# Patient Record
Sex: Male | Born: 1975 | Race: White | Hispanic: No | Marital: Married | Smoking: Former smoker
Health system: Southern US, Community
[De-identification: ages and names within clinical notes are randomized; demographics above are authoritative.]

## PROBLEM LIST (undated history)

## (undated) DIAGNOSIS — T7840XA Allergy, unspecified, initial encounter: Secondary | ICD-10-CM

## (undated) DIAGNOSIS — G249 Dystonia, unspecified: Secondary | ICD-10-CM

## (undated) HISTORY — PX: TESTICLE REMOVAL: SHX68

## (undated) HISTORY — DX: Dystonia, unspecified: G24.9

## (undated) HISTORY — DX: Allergy, unspecified, initial encounter: T78.40XA

---

## 2011-09-30 ENCOUNTER — Ambulatory Visit (INDEPENDENT_AMBULATORY_CARE_PROVIDER_SITE_OTHER): Payer: BC Managed Care – PPO | Admitting: Internal Medicine

## 2011-09-30 VITALS — BP 118/82 | HR 72 | Temp 97.9°F | Resp 16 | Ht 72.0 in | Wt 201.0 lb

## 2011-09-30 DIAGNOSIS — R05 Cough: Secondary | ICD-10-CM

## 2011-09-30 MED ORDER — ALBUTEROL SULFATE (2.5 MG/3ML) 0.083% IN NEBU: 2.5000 mg | INHALATION_SOLUTION | Freq: Once | RESPIRATORY_TRACT | Status: DC

## 2011-09-30 MED ORDER — ALBUTEROL SULFATE HFA 108 (90 BASE) MCG/ACT IN AERS: 2.0000 | INHALATION_SPRAY | Freq: Four times a day (QID) | RESPIRATORY_TRACT | Status: DC | PRN

## 2011-09-30 MED ORDER — AZITHROMYCIN 250 MG PO TABS: ORAL_TABLET | ORAL | Status: AC

## 2011-09-30 MED ORDER — IPRATROPIUM BROMIDE 0.06 % NA SOLN: 2.0000 | Freq: Three times a day (TID) | NASAL | Status: DC

## 2011-09-30 MED ORDER — ALBUTEROL SULFATE (2.5 MG/3ML) 0.083% IN NEBU
2.5000 mg | INHALATION_SOLUTION | Freq: Once | RESPIRATORY_TRACT | Status: AC
  Administered 2011-09-30: 2.5 mg via RESPIRATORY_TRACT

## 2011-09-30 NOTE — Progress Notes (Signed)
  Subjective:    Patient ID: David Silva, male    DOB: 1976-01-28, 36 y.o.   MRN: [id number]  HPI David Silva is here with a 3 day history of cough, rhinitis, without fever or chills.  He works in the Paediatric nurse (positive pressure) at [employer] and smokes electronic cigarettes.  He denies any history of pneumonia, asthma, or allergies.  He is generally healthy.  He has had exposure to one person at work who was ill.  He is coughing hard, but it is nonproductive and it is causing his throat to hurt.    Review of Systems  All other systems reviewed and are negative.  Negative except as noted in HPI     Objective:   Physical Exam  Vitals reviewed. Constitutional: He is oriented to person, place, and time. He appears well-developed and well-nourished.  HENT:  Head: Normocephalic.  Right Ear: External ear normal.  Left Ear: External ear normal.  Mouth/Throat: Oropharynx is clear and moist. No oropharyngeal exudate.  Eyes: Conjunctivae are normal.  Neck: Neck supple.  Cardiovascular: Normal rate, regular rhythm and normal heart sounds.   Pulmonary/Chest: Effort normal and breath sounds normal. No respiratory distress. He has no wheezes. He has no rales. He exhibits no tenderness.  Abdominal: Soft.  Lymphadenopathy:    He has no cervical adenopathy.  Neurological: He is alert and oriented to person, place, and time.  Skin: Skin is warm and dry.  Psychiatric: He has a normal mood and affect. His behavior is normal.          Assessment & Plan:  Cough with rhinitis, significantly improved after his albuterol nebulizer treatment.  Zpack, Atrovent nasal spray, and albuterol inhaler given to pt.  AVS printed and given pt.  RTC if not improved in 2-3 days.

## 2011-09-30 NOTE — Patient Instructions (Signed)
Take your azithromycin and inhaler and nasal spray as needed for your symptoms.  If you experience a worsening of your symptoms return to our clinic for evaluation. Bronchitis Bronchitis is the body's way of reacting to injury and/or infection (inflammation) of the bronchi. Bronchi are the air tubes that extend from the windpipe into the lungs. If the inflammation becomes severe, it may cause shortness of breath. CAUSES  Inflammation may be caused by:  A virus.   Germs (bacteria).   Dust.   Allergens.   Pollutants and many other irritants.  The cells lining the bronchial tree are covered with tiny hairs (cilia). These constantly beat upward, away from the lungs, toward the mouth. This keeps the lungs free of pollutants. When these cells become too irritated and are unable to do their job, mucus begins to develop. This causes the characteristic cough of bronchitis. The cough clears the lungs when the cilia are unable to do their job. Without either of these protective mechanisms, the mucus would settle in the lungs. Then you would develop pneumonia. Smoking is a common cause of bronchitis and can contribute to pneumonia. Stopping this habit is the single most important thing you can do to help yourself. TREATMENT   Your caregiver may prescribe an antibiotic if the cough is caused by bacteria. Also, medicines that open up your airways make it easier to breathe. Your caregiver may also recommend or prescribe an expectorant. It will loosen the mucus to be coughed up. Only take over-the-counter or prescription medicines for pain, discomfort, or fever as directed by your caregiver.   Removing whatever causes the problem (smoking, for example) is critical to preventing the problem from getting worse.   Cough suppressants may be prescribed for relief of cough symptoms.   Inhaled medicines may be prescribed to help with symptoms now and to help prevent problems from returning.   For those with  recurrent (chronic) bronchitis, there may be a need for steroid medicines.  SEEK IMMEDIATE MEDICAL CARE IF:   During treatment, you develop more pus-like mucus (purulent sputum).   You have a fever.   Your baby is older than 3 months with a rectal temperature of 102 F (38.9 C) or higher.   Your baby is 35 months old or younger with a rectal temperature of 100.4 F (38 C) or higher.   You become progressively more ill.   You have increased difficulty breathing, wheezing, or shortness of breath.  It is necessary to seek immediate medical care if you are elderly or sick from any other disease. MAKE SURE YOU:   Understand these instructions.   Will watch your condition.   Will get help right away if you are not doing well or get worse.  Document Released: 05/18/2005 Document Revised: 05/07/2011 Document Reviewed: 03/27/2008 [facility] Patient Information 2012 [place], [place].

## 2011-10-02 ENCOUNTER — Telehealth: Payer: Self-pay

## 2011-10-02 NOTE — Telephone Encounter (Signed)
.  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  Please call patient to advise at [phone].

## 2011-10-03 ENCOUNTER — Telehealth: Payer: Self-pay | Admitting: *Deleted

## 2011-10-03 NOTE — Telephone Encounter (Signed)
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 Glean Salvo. took phone call, placed PT on hold, and asked my advice. I advised Becky to tell the PT as long as he is on the up swing of his illness, taking his ABX, not running a fever, and feels good he is able to work. 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 Eileen Stanford

## 2011-10-04 NOTE — Telephone Encounter (Signed)
LMOM WITH NOTES AND TO CALL BACK IF NEEDED

## 2011-10-04 NOTE — Telephone Encounter (Signed)
Since he has been on the antibiotic now for several days, he should no longer be infectious.  If he is still having fever, he needs re-evaluation and shouldn't be at work.

## 2011-10-05 ENCOUNTER — Ambulatory Visit (INDEPENDENT_AMBULATORY_CARE_PROVIDER_SITE_OTHER): Payer: BC Managed Care – PPO | Admitting: Family Medicine

## 2011-10-05 ENCOUNTER — Encounter: Payer: Self-pay | Admitting: Family Medicine

## 2011-10-05 VITALS — BP 100/69 | HR 76 | Temp 97.8°F | Resp 18 | Ht 72.0 in | Wt 200.0 lb

## 2011-10-05 DIAGNOSIS — R059 Cough, unspecified: Secondary | ICD-10-CM

## 2011-10-05 DIAGNOSIS — J209 Acute bronchitis, unspecified: Secondary | ICD-10-CM

## 2011-10-05 DIAGNOSIS — R05 Cough: Secondary | ICD-10-CM

## 2011-10-05 MED ORDER — PREDNISONE 20 MG PO TABS: ORAL_TABLET | ORAL | Status: DC

## 2011-10-05 MED ORDER — HYDROCOD POLST-CHLORPHEN POLST 10-8 MG/5ML PO LQCR: 5.0000 mL | Freq: Two times a day (BID) | ORAL | Status: DC | PRN

## 2011-10-05 NOTE — Progress Notes (Signed)
Is a 36 year old gentleman comes in with persistent cough. He's been taking his inhalers which originally helped but are no longer effective. His cough is violent to the point of retching. He's able to sleep at night. He's having some chest tightness along with a cough.  No fever, no productive cough, no other upper respiratory symptoms.  Objective: Patient is coughing violently every 30 seconds or so. He's in no acute distress however.  HEENT: Unremarkable  Chest: Mild expiratory wheezes bilaterally  Heart: Regular no murmur  Skin: No significant eczema or rashes  Assessment: Reactive airways secondary to recent upper respiratory infection  Plan: Prednisone taper 20 mg 3-2-2-1-1-1 and Tussionex

## 2011-10-05 NOTE — Patient Instructions (Signed)
Cough, Adult  A cough is a reflex that helps clear your throat and airways. It can help heal the body or may be a reaction to an irritated airway. A cough may only last 2 or 3 weeks (acute) or may last more than 8 weeks (chronic).  CAUSES Acute cough:  Viral or bacterial infections.  Chronic cough:  Infections.   Allergies.   Asthma.   Post-nasal drip.   Smoking.   Heartburn or acid reflux.   Some medicines.   Chronic lung problems (COPD).   Cancer.  SYMPTOMS   Cough.   Fever.   Chest pain.   Increased breathing rate.   High-pitched whistling sound when breathing (wheezing).   Colored mucus that you cough up (sputum).  TREATMENT   A bacterial cough may be treated with antibiotic medicine.   A viral cough must run its course and will not respond to antibiotics.   Your caregiver may recommend other treatments if you have a chronic cough.  HOME CARE INSTRUCTIONS   Only take over-the-counter or prescription medicines for pain, discomfort, or fever as directed by your caregiver. Use cough suppressants only as directed by your caregiver.   Use a cold steam vaporizer or humidifier in your bedroom or home to help loosen secretions.   Sleep in a semi-upright position if your cough is worse at night.   Rest as needed.   Stop smoking if you smoke.  SEEK IMMEDIATE MEDICAL CARE IF:   You have pus in your sputum.   Your cough starts to worsen.   You cannot control your cough with suppressants and are losing sleep.   You begin coughing up blood.   You have difficulty breathing.   You develop pain which is getting worse or is uncontrolled with medicine.   You have a fever.  MAKE SURE YOU:   Understand these instructions.   Will watch your condition.   Will get help right away if you are not doing well or get worse.  Document Released: 11/14/2010 Document Revised: 05/07/2011 Document Reviewed: 11/14/2010 [facility] Patient Information 2012 [place],  [place].Asthma, Acute Bronchospasm Your exam shows you have asthma, or acute bronchospasm that acts like asthma. Bronchospasm means your air passages become narrowed. These conditions are due to inflammation and airway spasm that cause narrowing of the bronchial tubes in the lungs. This causes you to have wheezing and shortness of breath. CAUSES  Respiratory infections and allergies most often bring on these attacks. Smoking, air pollution, cold air, emotional upsets, and vigorous exercise can also bring them on.  TREATMENT   Treatment is aimed at making the narrowed airways larger. Mild asthma/bronchospasm is usually controlled with inhaled medicines. Albuterol is a common medicine that you breathe in to open spastic or narrowed airways. Some trade names for albuterol are Ventolin or Proventil. Steroid medicine is also used to reduce the inflammation when an attack is moderate or severe. Antibiotics (medications used to kill germs) are only used if a bacterial infection is present.   If you are pregnant and need to use Albuterol (Ventolin or Proventil), you can expect the baby to move more than usual shortly after the medicine is used.  HOME CARE INSTRUCTIONS   Rest.   Drink plenty of liquids. This helps the mucus to remain thin and easily coughed up. Do not use caffeine or alcohol.   Do not smoke. Avoid being exposed to second-hand smoke.   You play a critical role in keeping yourself in good health. Avoid exposure  to things that cause you to wheeze. Avoid exposure to things that cause you to have breathing problems. Keep your medications up-to-date and available. Carefully follow your doctor's treatment plan.   When pollen or pollution is bad, keep windows closed and use an air conditioner go to places with air conditioning. If you are allergic to furry pets or birds, find new homes for them or keep them outside.   Take your medicine exactly as prescribed.   Asthma requires careful medical  attention. See your caregiver for follow-up as advised. If you are more than [redacted] weeks pregnant and you were prescribed any new medications, let your Obstetrician know about the visit and how you are doing. Arrange a recheck.  SEEK IMMEDIATE MEDICAL CARE IF:   You are getting worse.   You have trouble breathing. If severe, call 911.   You develop chest pain or discomfort.   You are throwing up or not drinking fluids.   You are not getting better within 24 hours.   You are coughing up yellow, green, brown, or bloody sputum.   You develop a fever over 102 F (38.9 C).   You have trouble swallowing.  MAKE SURE YOU:   Understand these instructions.   Will watch your condition.   Will get help right away if you are not doing well or get worse.  Document Released: 09/02/2006 Document Revised: 05/07/2011 Document Reviewed: 05/02/2007 [facility] Patient Information 2012 [place], [place].

## 2011-10-12 ENCOUNTER — Telehealth: Payer: Self-pay

## 2011-10-12 NOTE — Telephone Encounter (Signed)
Patient was off work 8 days. He wants to file short term disability for 3 of those days. (work suggested that her do this). They need a letter stating he was out of work due to illness. He stated there was no form to be filled out. Please fax it to [phone], attn: David Stall

## 2011-10-12 NOTE — Telephone Encounter (Signed)
Dr. Milus Glazier,  Did you intend for this patient to be out of work?  Please reply in DOCUMENTATION and route to the clinical message pool.  Thanks!

## 2011-10-12 NOTE — Telephone Encounter (Signed)
PT WOULD LIKE TO SPEAK WITH DR KURT REGARDING HIS VISIT. STATES IT WASN'T ABOUT ANY MEDICINE PLEASE CALL [phone]

## 2011-10-12 NOTE — Telephone Encounter (Signed)
Okay to be out 3 days.

## 2011-10-13 NOTE — Telephone Encounter (Signed)
LMOM TO CB WITH DATES

## 2011-10-13 NOTE — Telephone Encounter (Signed)
Pt notified and note faxed.

## 2012-02-15 ENCOUNTER — Ambulatory Visit (INDEPENDENT_AMBULATORY_CARE_PROVIDER_SITE_OTHER): Payer: BC Managed Care – PPO | Admitting: Family Medicine

## 2012-02-15 VITALS — BP 128/74 | HR 61 | Temp 97.9°F | Resp 18 | Ht 73.0 in | Wt 215.0 lb

## 2012-02-15 DIAGNOSIS — M79604 Pain in right leg: Secondary | ICD-10-CM

## 2012-02-15 DIAGNOSIS — M79609 Pain in unspecified limb: Secondary | ICD-10-CM

## 2012-02-15 DIAGNOSIS — M62830 Muscle spasm of back: Secondary | ICD-10-CM

## 2012-02-15 DIAGNOSIS — M6740 Ganglion, unspecified site: Secondary | ICD-10-CM

## 2012-02-15 DIAGNOSIS — M5380 Other specified dorsopathies, site unspecified: Secondary | ICD-10-CM

## 2012-02-15 MED ORDER — NABUMETONE 750 MG PO TABS: 750.0000 mg | ORAL_TABLET | Freq: Two times a day (BID) | ORAL | Status: DC

## 2012-02-15 MED ORDER — METAXALONE 800 MG PO TABS: 800.0000 mg | ORAL_TABLET | Freq: Three times a day (TID) | ORAL | Status: DC

## 2012-02-15 NOTE — Patient Instructions (Signed)
Try to walk around some to stretch out legs, but minimize prolonged standing as possible.  Heat or ice/heat to back  If cyst is not going down over the next week we will aspirate and inject it.    If spasms continue many need physical therapy

## 2012-02-15 NOTE — Progress Notes (Signed)
Subjective: Patient has several problems. He has a painful tendon area on the lateral right foot that has been bothering him for couple weeks. He feels like that discomfort is caused him to walk funny, which has flared a cold back spasm problem that he's had for maybe 5 years. He's had severe spasm in his back, and now has pain in both legs. He feels like he walking funny from his back has pulled his legs also. He stands a lot at work. A specific injury. He does not play sports.  Objective: 36 year old male in a moderate amount of discomfort. Whenever he moves around it hurts. He has back appears mildly asymmetrical, with some prominence of the musculature in the right back medial and inferior to the scapula. That is where he sits hurts most also. He has fair flexion and extension and side to side tilt, though tilted to the left hurts him and advance flexing anteriorly past about 30 or 40 was hurting him. Straight leg raising test is negative on the left, but on the right he has tightness and pain, at about 60 in this region after.  Dorsiflexion and plantar flexion of his feet making her up in his legs. He has a nodular area on the right foot distal to the lateral malleolus which is more prominent when he dorsiflexes the foot. It is fluctuant feeling.  Assessment: Ganglion cyst right foot Back spasms Leg pains  Plan: Anti-inflammatory medications and muscle relaxants.

## 2012-11-25 ENCOUNTER — Ambulatory Visit (INDEPENDENT_AMBULATORY_CARE_PROVIDER_SITE_OTHER): Payer: BC Managed Care – PPO | Admitting: Family Medicine

## 2012-11-25 ENCOUNTER — Ambulatory Visit: Payer: BC Managed Care – PPO

## 2012-11-25 VITALS — BP 103/71 | HR 63 | Temp 98.1°F | Resp 16 | Ht 73.5 in | Wt 221.0 lb

## 2012-11-25 DIAGNOSIS — M79671 Pain in right foot: Secondary | ICD-10-CM

## 2012-11-25 DIAGNOSIS — M25473 Effusion, unspecified ankle: Secondary | ICD-10-CM

## 2012-11-25 DIAGNOSIS — M25474 Effusion, right foot: Secondary | ICD-10-CM

## 2012-11-25 DIAGNOSIS — M79609 Pain in unspecified limb: Secondary | ICD-10-CM

## 2012-11-25 MED ORDER — MELOXICAM 7.5 MG PO TABS: 7.5000 mg | ORAL_TABLET | Freq: Every day | ORAL | Status: DC

## 2012-11-25 NOTE — Patient Instructions (Addendum)
We will refer you to podiatry.  Try over the counter arch support insert.  mobic - 1-2 per day as needed - do not take other NSAIDS while taking this. Return to the clinic or go to the nearest emergency room if any of your symptoms worsen or new symptoms occur.

## 2012-11-25 NOTE — Progress Notes (Signed)
  Subjective:    Patient ID: David Silva, male    DOB: Aug 17, 1975, 37 y.o.   MRN: [id number]  HPI David Silva is a 37 y.o. male Here for R foot cyst. Initially noticed about a year ago, NKI. Out of the blue. Goes down then comes back. Initially for 3 weeks - treated with antiinflammatory.  Improved but not sure if it went away completely.  Current episode of swelling past week - more sore to stand, but walking ok.  Sore to move ankle certain ways.   Sore into legs at times - may be walking differently.  Back spasms at times.   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    Tx:otc Advil 400mg  tid at times.   Review of Systems  Constitutional: Negative for fever and chills.  Musculoskeletal: Positive for joint swelling (top of r foot. ) and arthralgias.  Skin: Positive for color change. Negative for rash and wound.       Objective:   Physical Exam  Vitals reviewed. Constitutional: He is oriented to person, place, and time. He appears well-developed and well-nourished. No distress.  Pulmonary/Chest: Effort normal.  Musculoskeletal:       Right lower leg: He exhibits no tenderness, no swelling and no edema.       Left lower leg: He exhibits no tenderness, no swelling and no edema.       Right foot: He exhibits swelling. He exhibits normal range of motion (from and resisted strength. ), no bony tenderness and normal capillary refill.       Feet:  Neurological: He is alert and oriented to person, place, and time.  nvi distally.   Skin: Skin is warm and dry. No rash noted.  Psychiatric: He has a normal mood and affect. His behavior is normal.      [facility] reading (PRIMARY) by  Dr. Neva Seat: R foot - NAD.Marland Kitchen      Assessment & Plan:  David Silva is a 37 y.o. male Right foot pain - Plan: DG Foot Complete Right  Swelling of foot joint, right - Plan: DG Foot Complete Right  Possible ganglion cyst of R foot - recurrent. Trial of mobic QD  prn, refer to podiatry for eval for aspiration. otc arch support trial prior to podiatry eval. rtc precautions.   Meds ordered this encounter  Medications  . meloxicam (MOBIC) 7.5 MG tablet    Sig: Take 1 tablet (7.5 mg total) by mouth daily.    Dispense:  30 tablet    Refill:  0

## 2013-07-03 ENCOUNTER — Ambulatory Visit (INDEPENDENT_AMBULATORY_CARE_PROVIDER_SITE_OTHER): Payer: BC Managed Care – PPO | Admitting: Family Medicine

## 2013-07-03 ENCOUNTER — Ambulatory Visit: Payer: BC Managed Care – PPO

## 2013-07-03 VITALS — BP 120/80 | HR 70 | Temp 97.1°F | Resp 16 | Ht 71.5 in | Wt 219.0 lb

## 2013-07-03 DIAGNOSIS — M5380 Other specified dorsopathies, site unspecified: Secondary | ICD-10-CM

## 2013-07-03 DIAGNOSIS — M549 Dorsalgia, unspecified: Secondary | ICD-10-CM

## 2013-07-03 DIAGNOSIS — T148XXA Other injury of unspecified body region, initial encounter: Secondary | ICD-10-CM

## 2013-07-03 DIAGNOSIS — M79609 Pain in unspecified limb: Secondary | ICD-10-CM

## 2013-07-03 DIAGNOSIS — M62830 Muscle spasm of back: Secondary | ICD-10-CM

## 2013-07-03 MED ORDER — METAXALONE 800 MG PO TABS: 800.0000 mg | ORAL_TABLET | Freq: Three times a day (TID) | ORAL | Status: DC

## 2013-07-03 MED ORDER — NABUMETONE 750 MG PO TABS: 750.0000 mg | ORAL_TABLET | Freq: Two times a day (BID) | ORAL | Status: DC
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
## 2013-07-03 NOTE — Patient Instructions (Signed)
Back Pain, Adult Low back pain is very common. About 1 in 5 people have back pain.The cause of low back pain is rarely dangerous. The pain often gets better over time.About half of people with a sudden onset of back pain feel better in just 2 weeks. About 8 in 10 people feel better by 6 weeks.  CAUSES Some common causes of back pain include:  Strain of the muscles or ligaments supporting the spine.  Wear and tear (degeneration) of the spinal discs.  Arthritis.  Direct injury to the back. DIAGNOSIS Most of the time, the direct cause of low back pain is not known.However, back pain can be treated effectively even when the exact cause of the pain is unknown.Answering your caregiver's questions about your overall health and symptoms is one of the most accurate ways to make sure the cause of your pain is not dangerous. If your caregiver needs more information, he or she may order lab work or imaging tests (X-rays or MRIs).However, even if imaging tests show changes in your back, this usually does not require surgery. HOME CARE INSTRUCTIONS For many people, back pain returns.Since low back pain is rarely dangerous, it is often a condition that people can learn to manageon their own.   Remain active. It is stressful on the back to sit or stand in one place. Do not sit, drive, or stand in one place for more than 30 minutes at a time. Take short walks on level surfaces as soon as pain allows.Try to increase the length of time you walk each day.  Do not stay in bed.Resting more than 1 or 2 days can delay your recovery.  Do not avoid exercise or work.Your body is made to move.It is not dangerous to be active, even though your back may hurt.Your back will likely heal faster if you return to being active before your pain is gone.  Pay attention to your body when you bend and lift. Many people have less discomfortwhen lifting if they bend their knees, keep the load close to their bodies,and  avoid twisting. Often, the most comfortable positions are those that put less stress on your recovering back.  Find a comfortable position to sleep. Use a firm mattress and lie on your side with your knees slightly bent. If you lie on your back, put a pillow under your knees.  Only take over-the-counter or prescription medicines as directed by your caregiver. Over-the-counter medicines to reduce pain and inflammation are often the most helpful.Your caregiver may prescribe muscle relaxant drugs.These medicines help dull your pain so you can more quickly return to your normal activities and healthy exercise.  Put ice on the injured area.  Put ice in a plastic bag.  Place a towel between your skin and the bag.  Leave the ice on for 15-20 minutes, 03-04 times a day for the first 2 to 3 days. After that, ice and heat may be alternated to reduce pain and spasms.  Ask your caregiver about trying back exercises and gentle massage. This may be of some benefit.  Avoid feeling anxious or stressed.Stress increases muscle tension and can worsen back pain.It is important to recognize when you are anxious or stressed and learn ways to manage it.Exercise is a great option. SEEK MEDICAL CARE IF:  You have pain that is not relieved with rest or medicine.  You have pain that does not improve in 1 week.  You have new symptoms.  You are generally not feeling well. SEEK   IMMEDIATE MEDICAL CARE IF:   You have pain that radiates from your back into your legs.  You develop new bowel or bladder control problems.  You have unusual weakness or numbness in your arms or legs.  You develop nausea or vomiting.  You develop abdominal pain.  You feel faint. Document Released: 05/18/2005 Document Revised: 11/17/2011 Document Reviewed: 10/06/2010 Exit[employer] Patient Information 2014 [employer].  

## 2013-12-11 ENCOUNTER — Ambulatory Visit (INDEPENDENT_AMBULATORY_CARE_PROVIDER_SITE_OTHER): Payer: BC Managed Care – PPO | Admitting: Physician Assistant

## 2013-12-11 VITALS — BP 122/88 | HR 72 | Temp 97.4°F | Resp 18 | Ht 72.25 in | Wt 218.4 lb

## 2013-12-11 DIAGNOSIS — M5380 Other specified dorsopathies, site unspecified: Secondary | ICD-10-CM

## 2013-12-11 DIAGNOSIS — M62838 Other muscle spasm: Secondary | ICD-10-CM

## 2013-12-11 DIAGNOSIS — R635 Abnormal weight gain: Secondary | ICD-10-CM

## 2013-12-11 DIAGNOSIS — R209 Unspecified disturbances of skin sensation: Secondary | ICD-10-CM

## 2013-12-11 DIAGNOSIS — M62830 Muscle spasm of back: Secondary | ICD-10-CM

## 2013-12-11 LAB — COMPREHENSIVE METABOLIC PANEL
ALT: 22 U/L (ref 0–53)
AST: 18 U/L (ref 0–37)
Albumin: 4.6 g/dL (ref 3.5–5.2)
Alkaline Phosphatase: 65 U/L (ref 39–117)
BUN: 12 mg/dL (ref 6–23)
CO2: 27 mEq/L (ref 19–32)
Calcium: 9.6 mg/dL (ref 8.4–10.5)
Chloride: 106 mEq/L (ref 96–112)
Creat: 0.91 mg/dL (ref 0.50–1.35)
Glucose, Bld: 78 mg/dL (ref 70–99)
Potassium: 4.2 mEq/L (ref 3.5–5.3)
Sodium: 141 mEq/L (ref 135–145)
Total Bilirubin: 0.4 mg/dL (ref 0.2–1.2)
Total Protein: 6.9 g/dL (ref 6.0–8.3)

## 2013-12-11 LAB — TSH: TSH: 1.955 u[IU]/mL (ref 0.350–4.500)

## 2013-12-11 MED ORDER — NABUMETONE 750 MG PO TABS: 750.0000 mg | ORAL_TABLET | Freq: Two times a day (BID) | ORAL | Status: DC

## 2013-12-11 MED ORDER — METAXALONE 800 MG PO TABS: 800.0000 mg | ORAL_TABLET | Freq: Three times a day (TID) | ORAL | Status: DC

## 2013-12-11 NOTE — Progress Notes (Signed)
   Subjective:    Patient ID: David Silva, male    DOB: 01-02-1976, 38 y.o.   MRN: [id number]  HPI 38 year old male presents for evaluation of muscle spasms. States this is a chronic problem that has been ongoing for over 15 years. He has intermittent flare ups of severe muscle spasms. Admits they usually respond well to anti-inflammatory medications and muscle relaxers. Was last seen in 07/2013 - had negative x-rays at the time and was placed on relafen and skelaxin which he reports worked quite well. Usually has relief in 3-4 days after started medications. Does get some relief from minor pain/spasm with OTC ibuprofen.  Cramping/spasm involves his mid-lower back but also has intermittent leg pains and cramping. Admits to a 20 pound weight gain in the past 2 years. He is working on [employer] and is counting calories. Is frustrated because he cannot exercise as much as he used to due to the muscle aches and pains. Is interested in an orthopedic evaluation at this time.  No bowel/bladder incontinence, saddle anesthesias, or weakness   Review of Systems  Constitutional: Positive for activity change (decreased exercise). Negative for fever and chills.  Gastrointestinal: Negative for abdominal pain.  Musculoskeletal: Positive for back pain and myalgias. Negative for joint swelling.  Neurological: Negative for weakness and numbness.       Objective:   Physical Exam  Constitutional: He is oriented to person, place, and time. He appears well-developed and well-nourished.  HENT:  Head: Normocephalic and atraumatic.  Right Ear: Hearing and external ear normal.  Left Ear: Hearing and external ear normal.  Eyes: Conjunctivae are normal.  Neck: Normal range of motion.  Cardiovascular: Normal rate.   Pulmonary/Chest: Effort normal.  Musculoskeletal:       Lumbar back: He exhibits decreased range of motion (secondary to spasm and pain), tenderness (right paraspinal) and spasm. He exhibits no bony  tenderness and no deformity.  5/5 strength  Neurological: He is alert and oriented to person, place, and time. He has normal strength.  Reflex Scores:      Patellar reflexes are 2+ on the right side and 2+ on the left side. SLR negative  Psychiatric: He has a normal mood and affect. His behavior is normal. Judgment and thought content normal.          Assessment & Plan:  Back spasm - Plan: metaxalone (SKELAXIN) 800 MG tablet, nabumetone (RELAFEN) 750 MG tablet, Ambulatory referral to Orthopedic Surgery  Spasm of muscle - Plan: Ambulatory referral to Orthopedic Surgery, Comprehensive metabolic panel  Disturbance of skin sensation - Plan: Comprehensive metabolic panel  Weight gain - Plan: TSH  Will treat with Relafen 750 mg bid and Skelaxin qhs prn spasm Referral placed to orthopedics for further w/u and management TSH, CMET pending RTC precautions discussed. F/u if symptoms worsening or fail to improve.

## 2013-12-15 ENCOUNTER — Telehealth: Payer: Self-pay | Admitting: Physician Assistant

## 2013-12-15 NOTE — Telephone Encounter (Signed)
Patient dropped off FMLA paperwork on 12/14/2013 and they were placed in [facility] box the following day on 12/15/2013. Patient saw her on 12/11/2013 for back spasms (previously saw Dr. Marin Comment in February for back pain). Patient wants paperwork dated through his ortho visit on 12/15/2013 however this is up to the discretion of the provider if she chooses to include this date or not. Please return to FMLA/Disabilities tray at checkout upon completion.   Thanks, [employer]

## 2014-01-15 ENCOUNTER — Encounter: Payer: Self-pay | Admitting: Neurology

## 2014-01-15 ENCOUNTER — Ambulatory Visit (INDEPENDENT_AMBULATORY_CARE_PROVIDER_SITE_OTHER): Payer: BC Managed Care – PPO | Admitting: Neurology

## 2014-01-15 VITALS — BP 116/76 | HR 91 | Temp 98.3°F | Ht 73.0 in | Wt 222.0 lb

## 2014-01-15 DIAGNOSIS — R251 Tremor, unspecified: Secondary | ICD-10-CM

## 2014-01-15 DIAGNOSIS — M5380 Other specified dorsopathies, site unspecified: Secondary | ICD-10-CM

## 2014-01-15 DIAGNOSIS — R259 Unspecified abnormal involuntary movements: Secondary | ICD-10-CM

## 2014-01-15 DIAGNOSIS — M62830 Muscle spasm of back: Secondary | ICD-10-CM

## 2014-01-15 NOTE — Progress Notes (Signed)
Subjective:    Patient ID: David Silva is a 38 y.o. male.  HPI    Star Age, MD, PhD [facility] [address] [place], [place] [address]  Dear Dr. Berenice Primas,   I saw your patient, David Silva, upon your kind request in my neurologic clinic today for initial consultation of his tremors. The patient is unaccompanied today. As you know, Mr. [name] is a 38 year old right-handed gentleman with an underlying medical history of muscle spasms, who has had a tremor in his RUE for the past month. He has had lower back spasms for years and is currently on an antiinflammatory and muscle relaxors. He had an appointment with an orthopedist at [facility], but canceled it. He feels, his tremors are getting worse. He has shooting pains in his muscle. He quit smoking, but smokes e-cigarattes, he drinks alcohol rarely and drinks about 3 caffeinated drinks per day. He feels that the spasms in his tremor interferes with his work. 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 He is wondering whether this is all related. He does not have a family history of muscle diseases or nerve disease as her tremors. A maternal great uncle had MS he reports.  His Past Medical History Is Significant For: Past Medical History  Diagnosis Date  . Allergy     His Past Surgical History Is Significant For: No past surgical history on file.  His Family History Is Significant For: Family History  Problem Relation Age of Onset  . Diabetes Maternal Grandmother   . Heart disease Maternal Grandmother     His Social History Is Significant For: History   Social History  . Marital Status: Single    Spouse Name: N/A    Number of Children: 0  . Years of Education: college   Occupational History  .      [place] History Main Topics  . Smoking status: Former Smoker    Quit date: 06/01/2013  . Smokeless tobacco: Current User     Comment: electronic cigs.  daily  . Alcohol Use: Yes     Comment: 4 drinks per  yearly  . Drug Use: No  . Sexual Activity: None   Other Topics Concern  . None   Social History Narrative   Patient resides with a friend    His Allergies Are:  Allergies  Allergen Reactions  . Sulfate   :   His Current Medications Are:  Outpatient Encounter Prescriptions as of 01/15/2014  Medication Sig  . cyclobenzaprine (FLEXERIL) 10 MG tablet Take 10 mg by mouth daily.  . methocarbamol (ROBAXIN) 750 MG tablet Take 750 mg by mouth 2 (two) times daily.  . nabumetone (RELAFEN) 750 MG tablet Take 1 tablet (750 mg total) by mouth 2 (two) times daily.  . [DISCONTINUED] albuterol (PROVENTIL HFA;VENTOLIN HFA) 108 (90 BASE) MCG/ACT inhaler Inhale 2 puffs into the lungs every 6 (six) hours as needed for wheezing.  . [DISCONTINUED] ibuprofen (ADVIL,MOTRIN) 400 MG tablet Take 400 mg by mouth every 6 (six) hours as needed.  . [DISCONTINUED] ipratropium (ATROVENT) 0.06 % nasal spray Place 2 sprays into the nose 3 (three) times daily.  . [DISCONTINUED] meloxicam (MOBIC) 7.5 MG tablet Take 1 tablet (7.5 mg total) by mouth daily.  . [DISCONTINUED] metaxalone (SKELAXIN) 800 MG tablet Take 1 tablet (800 mg total) by mouth 3 (three) times daily.  :   Review of Systems:  Out of a complete 14  point review of systems, all are reviewed and negative with the exception of these symptoms as listed below:   Review of Systems  Musculoskeletal: Positive for joint swelling.       Cramps, aching muscles  Neurological: Positive for tremors, weakness, numbness and headaches.    Objective:  Neurologic Exam  Physical Exam Physical Examination:   Filed Vitals:   01/15/14 0930  BP: 116/76  Pulse: 91  Temp: 98.3 F (36.8 C)    General Examination: The patient is a very pleasant 38 y.o. male in no acute distress. He appears well-developed and well-nourished and adequately groomed. He is quite nervous and anxious appearing. He is pacing from time  to time stating that it helps his back spasms. He is sweating quite profusely at times.  HEENT: Normocephalic, atraumatic, pupils are equal, round and reactive to light and accommodation. Funduscopic exam is normal with sharp disc margins noted. Extraocular tracking is good without limitation to gaze excursion or nystagmus noted. Normal smooth pursuit is noted. Hearing is grossly intact. Tympanic membranes are clear bilaterally. Face is symmetric with normal facial animation and normal facial sensation. Speech is clear with no dysarthria noted. There is no hypophonia. There is no lip, neck/head, jaw or voice tremor. Neck is supple with full range of passive and active motion. There are no carotid bruits on auscultation. Oropharynx exam reveals: mild mouth dryness, adequate dental hygiene and mild airway crowding.   Chest: Clear to auscultation without wheezing, rhonchi or crackles noted.  Heart: S1+S2+0, regular and normal without murmurs, rubs or gallops noted.   Abdomen: Soft, non-tender and non-distended with normal bowel sounds appreciated on auscultation.  Extremities: There is no pitting edema in the distal lower extremities bilaterally. Pedal pulses are intact.  Skin: Warm and dry without trophic changes noted. There are no varicose veins.  Musculoskeletal: exam reveals no obvious joint deformities, tenderness or joint swelling or erythema. He reports back spasms in the entire back.   Neurologically:  Mental status: The patient is awake, alert and oriented in all 4 spheres. His immediate and remote memory, attention, language skills and fund of knowledge are appropriate. There is no evidence of aphasia, agnosia, apraxia or anomia. Speech is clear with normal prosody and enunciation. Thought process is linear. Mood is constricted and affect is blunted.  Cranial nerves II - XII are as described above under HEENT exam. In addition: shoulder shrug is normal with equal shoulder height  noted. Motor exam: Normal bulk, strength and tone is noted. I do not detect any myoclonus or athetoid movements. He has no choreiform movements. There is no drift, or rebound. There is no resting tremor. There is a bilateral upper extremity postural and action tremor, which is minimal in degree, R more than L. There tremor frequency is fairly fast and the amplitude is small. On Archimedes spiral drawing there is mild tremulousness noted. Handwriting is not tremulous and illegible. There is no evidence of micrographia.  Romberg is negative. Reflexes are 2+ throughout. Babinski: Toes are flexor bilaterally. He has no ankle clonus. Fine motor skills and coordination: intact with normal finger taps, normal hand movements, normal rapid alternating patting, normal foot taps and normal foot agility.  Cerebellar testing: No dysmetria or intention tremor on finger to nose testing. Heel to shin is unremarkable bilaterally. There is no truncal or gait ataxia.  Sensory exam: intact to light touch, pinprick, vibration, temperature sense in the upper and lower extremities.  Gait, station and balance: He stands easily. No  veering to one side is noted. No leaning to one side is noted. Posture is age-appropriate and stance is narrow based. Gait shows normal stride length and normal pace. No problems turning are noted. He turns en bloc. Tandem walk is unremarkable. Intact toe and heel stance is noted.               Assessment and Plan:    In summary, Simpson Paulos is a very pleasant 38 y.o.-year old male with an underlying medical history of muscle spasms of over 15 years duration who presents with a one-month history of right upper extremity tremor. On examination he has a minimal degree of upper extremity postural and action tremor. This could be an enhanced physiological tremor. He does appear to be quite nervous and anxious today. He has been treated symptomatically with anti-inflammatory and muscle relaxers for his  back spasms. I suggested that we proceed with further blood work and an EMG nerve conduction study. We will call him with his test results. I suggested no new medications for his tremors. I explained to him that his tremors appear to be rather benign. He is reassured in that regard. Overall, his neurological exam is non-focal with the exception that he continues to report back spasms, which he reports have actually been better in the last 6 weeks. I suggested a see him back when his tests are completed. We will also be keeping him updated as we get test results back. I also explained to him that there is usually no specific medication for tremors. He certainly does not have any parkinsonism or abnormal involuntary movements that I could see other than a mild degree of tremor, right more than left.   I answered all his questions today and the patient was in agreement with the above outlined plan.   Thank you very much for allowing me to participate in the care of this nice patient. If I can be of any further assistance to you please do not hesitate to call me at [phone].  Sincerely,   Star Age, MD, PhD

## 2014-01-15 NOTE — Patient Instructions (Addendum)
  Please remember, that any kind of tremor may be exacerbated by anxiety, anger, nervousness, excitement, dehydration, sleep deprivation, by caffeine, and low blood sugar values or blood sugar fluctuations. Some medications, especially some antidepressants and lithium can cause or exacerbate tremors. Tremors may temporarily calm down her subside with the use of a benzodiazepine such as Valium or related medications and with alcohol. Be aware however that drinking alcohol is not an approved treatment or appropriate treatment for tremor control and long-term use of benzodiazepines such as Valium, lorazepam, alprazolam, or clonazepam can cause habit formation, physical and psychological addiction.  We will do blood work and an EMG (muscle electrical testing) and nerve conduction testing. No new medications are recommended at this time.  Try to reduce your caffeine intake to see if the tremor improves.

## 2014-01-16 ENCOUNTER — Ambulatory Visit (INDEPENDENT_AMBULATORY_CARE_PROVIDER_SITE_OTHER): Payer: BC Managed Care – PPO | Admitting: Family Medicine

## 2014-01-16 VITALS — BP 126/88 | HR 72 | Temp 98.1°F | Resp 16 | Ht 71.75 in | Wt 217.8 lb

## 2014-01-16 DIAGNOSIS — E559 Vitamin D deficiency, unspecified: Secondary | ICD-10-CM

## 2014-01-16 DIAGNOSIS — M5380 Other specified dorsopathies, site unspecified: Secondary | ICD-10-CM

## 2014-01-16 DIAGNOSIS — F439 Reaction to severe stress, unspecified: Secondary | ICD-10-CM

## 2014-01-16 DIAGNOSIS — M546 Pain in thoracic spine: Secondary | ICD-10-CM

## 2014-01-16 DIAGNOSIS — M79605 Pain in left leg: Secondary | ICD-10-CM

## 2014-01-16 DIAGNOSIS — Z733 Stress, not elsewhere classified: Secondary | ICD-10-CM

## 2014-01-16 DIAGNOSIS — R259 Unspecified abnormal involuntary movements: Secondary | ICD-10-CM

## 2014-01-16 DIAGNOSIS — R251 Tremor, unspecified: Secondary | ICD-10-CM

## 2014-01-16 DIAGNOSIS — M62830 Muscle spasm of back: Secondary | ICD-10-CM

## 2014-01-16 DIAGNOSIS — M542 Cervicalgia: Secondary | ICD-10-CM

## 2014-01-16 DIAGNOSIS — M79609 Pain in unspecified limb: Secondary | ICD-10-CM

## 2014-01-16 MED ORDER — SERTRALINE HCL 50 MG PO TABS: 50.0000 mg | ORAL_TABLET | Freq: Every day | ORAL | Status: DC

## 2014-01-16 MED ORDER — VITAMIN D3 1.25 MG (50000 UT) PO CAPS: 50000.0000 [IU] | ORAL_CAPSULE | ORAL | Status: DC

## 2014-01-16 NOTE — Progress Notes (Signed)
Subjective: Patient has continued to have problems with muscle spasms over the past month since seeing the PA here. He has seen an orthopedist several times. He has had a CT scan of his neck. No disc pinching anything. He saw a neurologist yesterday and a lot of labs were ordered. She was primarily interested in the tremor of his hand which she did not seem to think was anything of great significance. He has a lot of pain in his neck, pain and spasming in his back, and chronic hurting in his legs. He was told he had a small amount of clonus. This concerns him also, but the neurologist didn't comment on it. He denies any major new stress, other than the stress of not being get back to work yet. He's been out of work since he saw the PA. He feels pretty miserable.  Objective: No major distress. Chest clear. Heart regular without murmurs. Neck was fairly good range of motion but hurts when moving around. Tenderness in the paraspinous muscles of the neck. Mild tenderness both sides of back, more on the left than the right it seems, percussion. Spine nontender. Straight leg raising reveals tight hamstrings, more on the right than the left. The legs are not atrophic. Some of the labs at the neurologist got yesterday back. The vitamin D level is very low.  Assessment: Cervical pain and back pain and spasms History of tremor right arm, not grossly apparent right now. Stress Vitamin D deficiency  Plan: Vitamin D 50,000 units weekly for 6 weeks. Then we will need to get him on a daily dose of [phone] units daily. He is to return in 6 weeks.  Decided to begin him on sertraline for his anxiety. It may or may not help the muscle pains, but I thought it was worth a try.  Referral is being made to another neurologist since he would like a different opinion, will try [facility].

## 2014-01-16 NOTE — Patient Instructions (Signed)
Take the sertraline one daily. It is my hope that after about 2 weeks she will start feeling better on it.  Take the vitamin D 50,000 units one weekly for 6 weeks.  Referral is being made to [facility] for neurovascular evaluation  Try to get some regular exercise

## 2014-01-18 LAB — CERULOPLASMIN: Ceruloplasmin: 21.8 mg/dL (ref 15.0–30.0)

## 2014-01-18 LAB — ANA W/REFLEX: Anti Nuclear Antibody(ANA): NEGATIVE

## 2014-01-18 LAB — COPPER, SERUM: COPPER: 94 ug/dL (ref 72–166)

## 2014-01-18 LAB — VITAMIN E: Vitamin E (Alpha Tocopherol): 12.7 mg/L (ref 4.6–17.8)

## 2014-01-18 LAB — VITAMIN D 25 HYDROXY (VIT D DEFICIENCY, FRACTURES): Vit D, 25-Hydroxy: 12.4 ng/mL — ABNORMAL LOW (ref 30.0–100.0)

## 2014-01-18 LAB — VITAMIN B6: Vitamin B6: 7.8 ug/L (ref 5.3–46.7)

## 2014-01-18 LAB — HGB A1C W/O EAG: HEMOGLOBIN A1C: 5.5 % (ref 4.8–5.6)

## 2014-01-18 LAB — C-REACTIVE PROTEIN: CRP: 0.8 mg/L (ref 0.0–4.9)

## 2014-01-18 LAB — CK: Total CK: 98 U/L (ref 24–204)

## 2014-01-18 NOTE — Progress Notes (Signed)
Quick Note:  Please call patient, all labs look okay including vitamin B6, vitamin D, inflammatory marker and muscle enzyme as well as diabetes marker. His vitamin D level was low and he can take an over-the-counter vitamin D supplement, one to 2000 units daily should be fine. Vitamin D level can be checked in the future by his primary care physician after he has started supplementation. Recheck a vitamin D level by his PCP in 3 months should be fine. Star Age, MD, PhD [facility]  ______

## 2014-01-19 ENCOUNTER — Telehealth: Payer: Self-pay | Admitting: *Deleted

## 2014-01-19 NOTE — Telephone Encounter (Signed)
Patient aware of lab results.

## 2014-01-19 NOTE — Telephone Encounter (Signed)
Left message for patient to call for results.  Please call patient, all labs look okay including vitamin B6, vitamin D, inflammatory marker and muscle enzyme as well as diabetes marker. His vitamin D level was low and he can take an over-the-counter vitamin D supplement, one to 2000 units daily should be fine. Vitamin D level can be checked in the future by his primary care physician after he has started supplementation. Recheck a vitamin D level by his PCP in 3 months should be fine.

## 2014-01-29 ENCOUNTER — Ambulatory Visit (INDEPENDENT_AMBULATORY_CARE_PROVIDER_SITE_OTHER): Payer: BC Managed Care – PPO | Admitting: Neurology

## 2014-01-29 ENCOUNTER — Encounter (INDEPENDENT_AMBULATORY_CARE_PROVIDER_SITE_OTHER): Payer: Self-pay | Admitting: Radiology

## 2014-01-29 DIAGNOSIS — Z0289 Encounter for other administrative examinations: Secondary | ICD-10-CM

## 2014-01-29 DIAGNOSIS — R259 Unspecified abnormal involuntary movements: Secondary | ICD-10-CM

## 2014-01-29 DIAGNOSIS — M5380 Other specified dorsopathies, site unspecified: Secondary | ICD-10-CM

## 2014-01-29 DIAGNOSIS — R209 Unspecified disturbances of skin sensation: Secondary | ICD-10-CM

## 2014-01-29 DIAGNOSIS — R251 Tremor, unspecified: Secondary | ICD-10-CM

## 2014-01-29 DIAGNOSIS — M62830 Muscle spasm of back: Secondary | ICD-10-CM

## 2014-01-29 NOTE — Procedures (Signed)
HISTORY:  David Silva is a 38 year old gentleman with a history of back pain and spasms over the last 12 years. More recently, he has developed a tremor of the right upper extremity, and over the last 18 months, he has had some numbness of the right foot. The patient is being evaluated for possible neuropathy or a radiculopathy. The patient does report some neck discomfort as well.  NERVE CONDUCTION STUDIES:  Nerve conduction studies were performed on the right upper extremity. The distal motor latencies and motor amplitudes for the median and ulnar nerves were within normal limits. The F wave latencies and nerve conduction velocities for these nerves were also normal. The sensory latencies for the median and ulnar nerves were normal.  Nerve conduction studies were performed on the right lower extremity. The distal motor latencies and motor amplitudes for the peroneal and posterior tibial nerves were within normal limits. The nerve conduction velocities for these nerves were also normal. The F reflex latencies were normal for the peroneal and posterior tibial nerves on the right. The sensory latency for the right peroneal nerve was within normal limits.   EMG STUDIES:  EMG study was performed on the right upper extremity:  The first dorsal interosseous muscle reveals 2 to 4 K units with full recruitment. No fibrillations or positive waves were noted. The abductor pollicis brevis muscle reveals 2 to 4 K units with full recruitment. No fibrillations or positive waves were noted. The extensor indicis proprius muscle reveals 1 to 3 K units with full recruitment. No fibrillations or positive waves were noted. The pronator teres muscle reveals 2 to 3 K units with full recruitment. No fibrillations or positive waves were noted. The biceps muscle reveals 1 to 2 K units with full recruitment. No fibrillations or positive waves were noted. The triceps muscle reveals 2 to 4 K units with full  recruitment. No fibrillations or positive waves were noted. The anterior deltoid muscle reveals 2 to 3 K units with full recruitment. No fibrillations or positive waves were noted. The cervical paraspinal muscles were tested at 2 levels. No abnormalities of insertional activity were seen at either level tested. There was good relaxation.  EMG study was performed on the right lower extremity:  The tibialis anterior muscle reveals 2 to 4K motor units with full recruitment. No fibrillations or positive waves were seen. The peroneus tertius muscle reveals 2 to 4K motor units with full recruitment. No fibrillations or positive waves were seen. The medial gastrocnemius muscle reveals 1 to 3K motor units with full recruitment. No fibrillations or positive waves were seen. The vastus lateralis muscle reveals 2 to 4K motor units with full recruitment. No fibrillations or positive waves were seen. The iliopsoas muscle reveals 2 to 4K motor units with full recruitment. No fibrillations or positive waves were seen. The biceps femoris muscle (long head) reveals 2 to 4K motor units with full recruitment. No fibrillations or positive waves were seen. The lumbosacral paraspinal muscles were tested at 3 levels, and revealed no abnormalities of insertional activity at all 3 levels tested. There was good relaxation.   IMPRESSION:  Nerve conduction studies done on the right upper and right lower extremities were unremarkable, without evidence of a neuropathy. EMG evaluation of the right upper and right lower extremities were unremarkable, without evidence of an overlying cervical or a lumbosacral radiculopathy.  Jill Alexanders MD 01/29/2014 4:26 PM  [facility] [address] [place], [place] [address]  Phone  [phone] Fax [phone]

## 2014-01-29 NOTE — Progress Notes (Signed)
Quick Note:  Please call and advise the patient that the recent EMG and nerve conduction velocity test, which is the electrical nerve and muscle test we we performed, was reported as within normal limits. We checked for abnormal electrical discharges in the muscles or nerves and the report suggested normal findings. No further action is required on this test at this time. Please remind patient to keep any upcoming appointments or tests and to call us with any interim questions, concerns, problems or updates. Thanks,  Jorryn Hershberger, MD, PhD   ______ 

## 2014-02-01 ENCOUNTER — Telehealth: Payer: Self-pay

## 2014-02-01 NOTE — Telephone Encounter (Signed)
Message copied by Milta Deiters on Thu Feb 01, 2014 11:07 AM ------      Message from: Star Age      Created: Mon Jan 29, 2014  5:12 PM       Please call and advise the patient that the recent EMG and nerve conduction velocity test, which is the electrical nerve and muscle test we we performed, was reported as within normal limits. We checked for abnormal electrical discharges in the muscles or nerves and the report suggested normal findings. No further action is required on this test at this time. Please remind patient to keep any upcoming appointments or tests and to call us with any interim questions, concerns, problems or updates. Thanks,       Star Age, MD, PhD             ------

## 2014-02-01 NOTE — Telephone Encounter (Signed)
Called patient and gave results per Dr. Guadelupe Sabin previous notes. Patient wanted to let Dr. Rexene Alberts know he is  having continuous pain around the base of his skull and neck. 3 on pain scale currently. Pain has reached as high as a 7 after working a full week last week.

## 2014-06-26 ENCOUNTER — Ambulatory Visit: Payer: BC Managed Care – PPO | Admitting: Neurology

## 2014-10-27 ENCOUNTER — Encounter (HOSPITAL_COMMUNITY): Payer: Self-pay | Admitting: Emergency Medicine

## 2014-10-27 ENCOUNTER — Emergency Department (HOSPITAL_COMMUNITY)
Admission: EM | Admit: 2014-10-27 | Discharge: 2014-10-27 | Disposition: A | Discharge status: Home / Self Care | Payer: BLUE CROSS/BLUE SHIELD | Attending: Emergency Medicine | Admitting: Emergency Medicine

## 2014-10-27 ENCOUNTER — Emergency Department (HOSPITAL_COMMUNITY): Payer: BLUE CROSS/BLUE SHIELD

## 2014-10-27 DIAGNOSIS — M62838 Other muscle spasm: Secondary | ICD-10-CM | POA: Diagnosis not present

## 2014-10-27 DIAGNOSIS — Z79899 Other long term (current) drug therapy: Secondary | ICD-10-CM | POA: Insufficient documentation

## 2014-10-27 DIAGNOSIS — R0789 Other chest pain: Secondary | ICD-10-CM | POA: Diagnosis not present

## 2014-10-27 DIAGNOSIS — R000 Tachycardia, unspecified: Secondary | ICD-10-CM | POA: Insufficient documentation

## 2014-10-27 DIAGNOSIS — Z87891 Personal history of nicotine dependence: Secondary | ICD-10-CM | POA: Diagnosis not present

## 2014-10-27 LAB — URINALYSIS, ROUTINE W REFLEX MICROSCOPIC
Bilirubin Urine: NEGATIVE
GLUCOSE, UA: 250 mg/dL — AB
Ketones, ur: NEGATIVE mg/dL
Leukocytes, UA: NEGATIVE
Nitrite: NEGATIVE
Protein, ur: NEGATIVE mg/dL
SPECIFIC GRAVITY, URINE: 1.012 (ref 1.005–1.030)
UROBILINOGEN UA: 0.2 mg/dL (ref 0.0–1.0)
pH: 5.5 (ref 5.0–8.0)

## 2014-10-27 LAB — BASIC METABOLIC PANEL
Anion gap: 10 (ref 5–15)
BUN: 16 mg/dL (ref 6–20)
CO2: 23 mmol/L (ref 22–32)
Calcium: 9.3 mg/dL (ref 8.9–10.3)
Chloride: 101 mmol/L (ref 101–111)
Creatinine, Ser: 0.67 mg/dL (ref 0.61–1.24)
Glucose, Bld: 130 mg/dL — ABNORMAL HIGH (ref 65–99)
Potassium: 4.1 mmol/L (ref 3.5–5.1)
Sodium: 134 mmol/L — ABNORMAL LOW (ref 135–145)

## 2014-10-27 LAB — CBC WITH DIFFERENTIAL/PLATELET
BASOS PCT: 0 % (ref 0–1)
Basophils Absolute: 0 10*3/uL (ref 0.0–0.1)
EOS ABS: 0 10*3/uL (ref 0.0–0.7)
EOS PCT: 0 % (ref 0–5)
HCT: 46.9 % (ref 39.0–52.0)
Hemoglobin: 15.9 g/dL (ref 13.0–17.0)
LYMPHS PCT: 5 % — AB (ref 12–46)
Lymphs Abs: 0.9 10*3/uL (ref 0.7–4.0)
MCH: 30.4 pg (ref 26.0–34.0)
MCHC: 33.9 g/dL (ref 30.0–36.0)
MCV: 89.7 fL (ref 78.0–100.0)
MONOS PCT: 7 % (ref 3–12)
Monocytes Absolute: 1.3 10*3/uL — ABNORMAL HIGH (ref 0.1–1.0)
NEUTROS ABS: 17.1 10*3/uL — AB (ref 1.7–7.7)
Neutrophils Relative %: 88 % — ABNORMAL HIGH (ref 43–77)
Platelets: 241 10*3/uL (ref 150–400)
RBC: 5.23 MIL/uL (ref 4.22–5.81)
RDW: 12.3 % (ref 11.5–15.5)
WBC: 19.4 10*3/uL — AB (ref 4.0–10.5)

## 2014-10-27 LAB — MAGNESIUM: Magnesium: 1.8 mg/dL (ref 1.7–2.4)

## 2014-10-27 LAB — URINE MICROSCOPIC-ADD ON

## 2014-10-27 MED ORDER — MORPHINE SULFATE 4 MG/ML IJ SOLN
8.0000 mg | Freq: Once | INTRAMUSCULAR | Status: AC
  Administered 2014-10-27: 8 mg via INTRAVENOUS
  Filled 2014-10-27: qty 2

## 2014-10-27 MED ORDER — SODIUM CHLORIDE 0.9 % IV BOLUS (SEPSIS)
1000.0000 mL | Freq: Once | INTRAVENOUS | Status: AC
  Administered 2014-10-27: 1000 mL via INTRAVENOUS

## 2014-10-27 MED ORDER — DIAZEPAM 5 MG PO TABS
5.0000 mg | ORAL_TABLET | Freq: Once | ORAL | Status: AC
  Administered 2014-10-27: 5 mg via ORAL
  Filled 2014-10-27: qty 1

## 2014-10-27 MED ORDER — DIAZEPAM 5 MG PO TABS
5.0000 mg | ORAL_TABLET | Freq: Three times a day (TID) | ORAL | Status: DC | PRN
Start: 2014-10-27 — End: 2015-01-21

## 2014-10-27 NOTE — ED Notes (Signed)
Family at bedside. 

## 2014-10-27 NOTE — ED Provider Notes (Signed)
CSN: [id number]     Arrival date & time 10/27/14  0554 History   First MD Initiated Contact with Patient 10/27/14 [phone]     Chief Complaint  Patient presents with  . Spasms     (Consider location/radiation/quality/duration/timing/severity/associated sxs/prior Treatment) HPI  39 year old male presents with severe muscle spasms over his upper back/shoulders as well as lower chest. Patient has a past medical history of dystonia and is chronically on trihexyphenidyl to help prevent the spasms but since 2 days ago has been having worsening severe spasms. Patient states he has had flares before but has never had to go the ER for it. Is in severe pain and is unable to lie flat due to the pain. Denies a cough, shortness of breath, fevers, vomiting, or diarrhea. Denies any abdominal pain. Did not try any acute muscle relaxants.  Past Medical History  Diagnosis Date  . Allergy    History reviewed. No pertinent past surgical history. Family History  Problem Relation Age of Onset  . Diabetes Maternal Grandmother   . Heart disease Maternal Grandmother    History  Substance Use Topics  . Smoking status: Former Smoker    Quit date: 06/01/2013  . Smokeless tobacco: Current User     Comment: electronic cigs. daily  . Alcohol Use: Yes     Comment: 4 drinks per  yearly    Review of Systems  Constitutional: Negative for fever.  Respiratory: Negative for shortness of breath.   Cardiovascular: Positive for chest pain (spasm).  Gastrointestinal: Negative for vomiting, abdominal pain and diarrhea.  Musculoskeletal: Positive for myalgias.  Neurological: Negative for weakness.  All other systems reviewed and are negative.     Allergies  Sulfate  Home Medications   Prior to Admission medications   Medication Sig Start Date End Date Taking? Authorizing Provider  Cholecalciferol 5000 UNITS TABS Take 1 tablet by mouth 4 (four) times a week.   Yes Historical Provider, MD  methocarbamol (ROBAXIN)  750 MG tablet Take 750 mg by mouth 2 (two) times daily as needed for muscle spasms.    Yes Historical Provider, MD  trihexyphenidyl (ARTANE) 2 MG tablet Take 1 mg by mouth 2 (two) times daily with a meal.   Yes Historical Provider, MD  Cholecalciferol (VITAMIN D3) 50000 UNITS CAPS Take 50,000 Int'l Units by mouth once a week. Patient not taking: Reported on 10/27/2014 01/16/14   Posey Boyer, MD  cyclobenzaprine (FLEXERIL) 10 MG tablet Take 10 mg by mouth daily.    Historical Provider, MD  nabumetone (RELAFEN) 750 MG tablet Take 1 tablet (750 mg total) by mouth 2 (two) times daily. Patient not taking: Reported on 10/27/2014 12/11/13   Collene Leyden, PA-C  sertraline (ZOLOFT) 50 MG tablet Take 1 tablet (50 mg total) by mouth daily. Patient not taking: Reported on 10/27/2014 01/16/14   Posey Boyer, MD   BP 160/110 mmHg  Pulse 113  Temp(Src) 97.7 F (36.5 C) (Oral)  Resp 20  SpO2 97% Physical Exam  Constitutional: He is oriented to person, place, and time. He appears well-developed and well-nourished.  HENT:  Head: Normocephalic and atraumatic.  Right Ear: External ear normal.  Left Ear: External ear normal.  Nose: Nose normal.  Eyes: Right eye exhibits no discharge. Left eye exhibits no discharge.  Neck: Neck supple.  Cardiovascular: Regular rhythm, normal heart sounds and intact distal pulses.  Tachycardia present.   Pulmonary/Chest: Effort normal and breath sounds normal. He exhibits tenderness.    Abdominal: Soft.  He exhibits no distension. There is no tenderness.  Musculoskeletal: He exhibits no edema.       Cervical back: He exhibits tenderness and spasm.       Back:  Neurological: He is alert and oriented to person, place, and time.  Skin: Skin is warm and dry.  Nursing note and vitals reviewed.   ED Course  Procedures (including critical care time) Labs Review Labs Reviewed  BASIC METABOLIC PANEL - Abnormal; Notable for the following:    Sodium 134 (*)    Glucose,  Bld 130 (*)    All other components within normal limits  CBC WITH DIFFERENTIAL/PLATELET - Abnormal; Notable for the following:    WBC 19.4 (*)    Neutrophils Relative % 88 (*)    Neutro Abs 17.1 (*)    Lymphocytes Relative 5 (*)    Monocytes Absolute 1.3 (*)    All other components within normal limits  URINALYSIS, ROUTINE W REFLEX MICROSCOPIC (NOT AT [facility]) - Abnormal; Notable for the following:    Glucose, UA 250 (*)    Hgb urine dipstick TRACE (*)    All other components within normal limits  MAGNESIUM  URINE MICROSCOPIC-ADD ON    Imaging Review Dg Chest Port 1 View  10/27/2014   CLINICAL DATA:  Chest tightness and shortness breath.  EXAM: PORTABLE CHEST - 1 VIEW  COMPARISON:  Thoracic spine films on 07/03/2013  FINDINGS: Lung volumes are very low with bibasilar atelectasis present. There is no evidence of pulmonary edema, consolidation, pneumothorax, nodule or pleural fluid. The heart size is normal. Visualized bony structures are unremarkable.  IMPRESSION: Low lung volumes with bibasilar atelectasis.   Electronically Signed   By: Aletta Edouard M.D.   On: 10/27/2014 09:29     EKG Interpretation None      MDM   Final diagnoses:  Muscle spasms of neck    Patient feels significantly improved after being given IV morphine and oral Valium. His symptoms inconsistent with significant muscle spasms and thus will treat with oral Valium at home. There has been no fever or infectious symptoms but given his significant white blood cell count elevation a urinalysis and chest x-ray were obtained. These are both negative. WBC is likely from a stress response. Urine does have trace blood as well as trace RBCs. While he could have very mild rhabdo (normal Creatnine) I doubt this. Given fluids, recommend increase fluids at home as well as f/u with PCP for recheck and re-evaluation of urine.    Sherwood Gambler, MD 10/27/14 1014

## 2014-10-27 NOTE — ED Notes (Signed)
Attempted to stick patient, was unsuccessful, notified rn tim

## 2014-10-27 NOTE — ED Notes (Signed)
Pt arrived to teh ED with a complaint of muscle spasms that are effecting his torso and shoulders.  Pt has been diagnosed with dystonia and has nmedications which have not been effective.  Pt states pain has been severe since yesterday evening.

## 2014-10-27 NOTE — Discharge Instructions (Signed)
Muscle Cramps and Spasms Muscle cramps and spasms occur when a muscle or muscles tighten and you have no control over this tightening (involuntary muscle contraction). They are a common problem and can develop in any muscle. The most common place is in the calf muscles of the leg. Both muscle cramps and muscle spasms are involuntary muscle contractions, but they also have differences:   Muscle cramps are sporadic and painful. They may last a few seconds to a quarter of an hour. Muscle cramps are often more forceful and last longer than muscle spasms.  Muscle spasms may or may not be painful. They may also last just a few seconds or much longer. CAUSES  It is uncommon for cramps or spasms to be due to a serious underlying problem. In many cases, the cause of cramps or spasms is unknown. Some common causes are:   Overexertion.   Overuse from repetitive motions (doing the same thing over and over).   Remaining in a certain position for a long period of time.   Improper preparation, form, or technique while performing a sport or activity.   Dehydration.   Injury.   Side effects of some medicines.   Abnormally low levels of the salts and ions in your blood (electrolytes), especially potassium and calcium. This could happen if you are taking water pills (diuretics) or you are pregnant.  Some underlying medical problems can make it more likely to develop cramps or spasms. These include, but are not limited to:   Diabetes.   Parkinson disease.   Hormone disorders, such as thyroid problems.   Alcohol abuse.   Diseases specific to muscles, joints, and bones.   Blood vessel disease where not enough blood is getting to the muscles.  HOME CARE INSTRUCTIONS   Stay well hydrated. Drink enough water and fluids to keep your urine clear or pale yellow.  It may be helpful to massage, stretch, and relax the affected muscle.  For tight or tense muscles, use a warm towel, heating  pad, or hot shower water directed to the affected area.  If you are sore or have pain after a cramp or spasm, applying ice to the affected area may relieve discomfort.  Put ice in a plastic bag.  Place a towel between your skin and the bag.  Leave the ice on for 15-20 minutes, 03-04 times a day.  Medicines used to treat a known cause of cramps or spasms may help reduce their frequency or severity. Only take over-the-counter or prescription medicines as directed by your caregiver. SEEK MEDICAL CARE IF:  Your cramps or spasms get more severe, more frequent, or do not improve over time.  MAKE SURE YOU:   Understand these instructions.  Will watch your condition.  Will get help right away if you are not doing well or get worse. Document Released: 11/07/2001 Document Revised: 09/12/2012 Document Reviewed: 05/04/2012 [facility] Patient Information 2015 [place], [place]. This information is not intended to replace advice given to you by your health care provider. Make sure you discuss any questions you have with your health care provider.  Hematuria Hematuria is blood in your urine. It can be caused by a bladder infection, kidney infection, prostate infection, kidney stone, or cancer of your urinary tract. Infections can usually be treated with medicine, and a kidney stone usually will pass through your urine. If neither of these is the cause of your hematuria, further workup to find out the reason may be needed. It is very important that  you tell your health care provider about any blood you see in your urine, even if the blood stops without treatment or happens without causing pain. Blood in your urine that happens and then stops and then happens again can be a symptom of a very serious condition. Also, pain is not a symptom in the initial stages of many urinary cancers. HOME CARE INSTRUCTIONS   Drink lots of fluid, 3-4 quarts a day. If you have been diagnosed with an infection, cranberry juice  is especially recommended, in addition to large amounts of water.  Avoid caffeine, tea, and carbonated beverages because they tend to irritate the bladder.  Avoid alcohol because it may irritate the prostate.  Take all medicines as directed by your health care provider.  If you were prescribed an antibiotic medicine, finish it all even if you start to feel better.  If you have been diagnosed with a kidney stone, follow your health care provider's instructions regarding straining your urine to catch the stone.  Empty your bladder often. Avoid holding urine for long periods of time.  After a bowel movement, women should cleanse front to back. Use each tissue only once.  Empty your bladder before and after sexual intercourse if you are a male. SEEK MEDICAL CARE IF:  You develop back pain.  You have a fever.  You have a feeling of sickness in your stomach (nausea) or vomiting.  Your symptoms are not better in 3 days. Return sooner if you are getting worse. SEEK IMMEDIATE MEDICAL CARE IF:   You develop severe vomiting and are unable to keep the medicine down.  You develop severe back or abdominal pain despite taking your medicines.  You begin passing a large amount of blood or clots in your urine.  You feel extremely weak or faint, or you pass out. MAKE SURE YOU:   Understand these instructions.  Will watch your condition.  Will get help right away if you are not doing well or get worse. Document Released: 05/18/2005 Document Revised: 10/02/2013 Document Reviewed: 01/16/2013 [facility] Patient Information 2015 [place], [place]. This information is not intended to replace advice given to you by your health care provider. Make sure you discuss any questions you have with your health care provider.

## 2015-01-21 ENCOUNTER — Ambulatory Visit (INDEPENDENT_AMBULATORY_CARE_PROVIDER_SITE_OTHER): Payer: BLUE CROSS/BLUE SHIELD | Admitting: Physician Assistant

## 2015-01-21 VITALS — BP 122/80 | HR 79 | Temp 98.3°F | Resp 16 | Ht 73.0 in | Wt 232.6 lb

## 2015-01-21 DIAGNOSIS — R319 Hematuria, unspecified: Secondary | ICD-10-CM | POA: Diagnosis not present

## 2015-01-21 DIAGNOSIS — G249 Dystonia, unspecified: Secondary | ICD-10-CM

## 2015-01-21 DIAGNOSIS — R7309 Other abnormal glucose: Secondary | ICD-10-CM

## 2015-01-21 LAB — POCT UA - MICROSCOPIC ONLY
Bacteria, U Microscopic: NEGATIVE
CASTS, UR, LPF, POC: NEGATIVE
Crystals, Ur, HPF, POC: NEGATIVE
EPITHELIAL CELLS, URINE PER MICROSCOPY: NEGATIVE
MUCUS UA: POSITIVE
RBC, urine, microscopic: NEGATIVE
WBC, Ur, HPF, POC: NEGATIVE
YEAST UA: NEGATIVE

## 2015-01-21 LAB — POCT URINALYSIS DIPSTICK
Bilirubin, UA: NEGATIVE
GLUCOSE UA: NEGATIVE
Leukocytes, UA: NEGATIVE
Nitrite, UA: NEGATIVE
PROTEIN UA: NEGATIVE
SPEC GRAV UA: 1.025
Urobilinogen, UA: 0.2
pH, UA: 5.5

## 2015-01-21 LAB — POCT CBC
Granulocyte percent: 58 %G (ref 37–80)
HCT, POC: 48.2 % (ref 43.5–53.7)
HEMOGLOBIN: 15.6 g/dL (ref 14.1–18.1)
Lymph, poc: 2.7 (ref 0.6–3.4)
MCH: 28.2 pg (ref 27–31.2)
MCHC: 32.3 g/dL (ref 31.8–35.4)
MCV: 87.4 fL (ref 80–97)
MID (cbc): 0.3 (ref 0–0.9)
MPV: 7.9 fL (ref 0–99.8)
PLATELET COUNT, POC: 272 10*3/uL (ref 142–424)
POC Granulocyte: 4.1 (ref 2–6.9)
POC LYMPH PERCENT: 38.4 %L (ref 10–50)
POC MID %: 3.6 % (ref 0–12)
RBC: 5.52 M/uL (ref 4.69–6.13)
RDW, POC: 13.4 %
WBC: 7.1 10*3/uL (ref 4.6–10.2)

## 2015-01-21 LAB — POCT GLYCOSYLATED HEMOGLOBIN (HGB A1C): Hemoglobin A1C: 5.4

## 2015-01-21 LAB — GLUCOSE, POCT (MANUAL RESULT ENTRY): POC GLUCOSE: 84 mg/dL (ref 70–99)

## 2015-01-21 MED ORDER — DIAZEPAM 5 MG PO TABS
5.0000 mg | ORAL_TABLET | Freq: Three times a day (TID) | ORAL | Status: DC | PRN
Start: 2015-01-21 — End: 2017-05-28

## 2015-01-21 NOTE — Patient Instructions (Signed)
Return at your convenience for a complete physical.

## 2015-01-21 NOTE — Progress Notes (Signed)
[facility] [address] [place] [address] [phone]  Date:  01/21/2015   Name:  David Silva   DOB:  Aug 07, 1975   MRN:  [id number]  PCP:  Ruben Reason, MD    Chief Complaint: Establish PCP; Tremors; Follow-up; Hematuria; and Medication Refill   History of Present Illness:  This is a 39 y.o. male with PMH dystonia who is presenting needing a refill of valium for dystonia. He was diagnosed with dystonia 1 year ago. He sees Dr. Kyra Searles at [facility]. Last appt 07/2014. Next appt in November. He was seen at the ED 3 months ago with a dystonia flare. When he gets flares he has intense pain at his diaphragm and extending around thoracic back. He also has right forearm spasms and pain. He states he is having worsening right forearm spasms currently. Diaphragm and back pain not as bad today. The ED discharged him with robaxin, flexeril and valium. He states the robaxin and flexeril have not helped at all. He was given #15 pills of valium which has lasted him 3 months. He will take 1 every other week with worsening spasms and pain and helps. He is wanting a refill today. Denies SOB.  When in the hospital he had a wbc of 19, thought to be d/t stress reaction. He also had trace rbcs on UA and glucose of 130 on CMP. Kidney and liver function normal. Mag normal. He is wanting to follow up today on abnormal tests. No hx of DM but does state he has gained 30 pounds in the past 1 year since being dx'd with dystonia. He states he used to be more active but now comes home from work and his muscles are sore.  Review of Systems:  Review of Systems See HPI  There are no active problems to display for this patient.   Prior to Admission medications   Medication Sig Start Date End Date Taking? Authorizing Provider         trihexyphenidyl (ARTANE) 2 MG tablet Take 1 mg by mouth 2 (two) times daily with a meal.   Yes Historical Provider, MD  Cholecalciferol 5000 UNITS TABS  Take 1 tablet by mouth 4 (four) times a week.   Yes Historical Provider, MD         diazepam (VALIUM) 5 MG tablet Take 1 tablet (5 mg total) by mouth every 8 (eight) hours as needed for muscle spasms. Patient not taking: Reported on 01/21/2015 10/27/14  Yes Sherwood Gambler, MD         nabumetone (RELAFEN) 750 MG tablet Take 1 tablet (750 mg total) by mouth 2 (two) times daily. Patient not taking: Reported on 10/27/2014 12/11/13  prn Collene Leyden, PA-C           Allergies  Allergen Reactions  . Sulfate Rash    History reviewed. No pertinent past surgical history.  Social History  Substance Use Topics  . Smoking status: Former Smoker    Quit date: 06/01/2013  . Smokeless tobacco: Current User     Comment: electronic cigs. daily  . Alcohol Use: Yes     Comment: 4 drinks per  yearly    Family History  Problem Relation Age of Onset  . Diabetes Maternal Grandmother   . Heart disease Maternal Grandmother     Medication list has been reviewed and updated.  Physical Examination:  Physical Exam  Constitutional: He is oriented to person, place, and time. He appears well-developed and  well-nourished. No distress.  HENT:  Head: Normocephalic and atraumatic.  Right Ear: Hearing normal.  Left Ear: Hearing normal.  Nose: Nose normal.  Eyes: Conjunctivae and lids are normal. Right eye exhibits no discharge. Left eye exhibits no discharge. No scleral icterus.  Cardiovascular: Normal rate, regular rhythm and normal pulses.   No murmur heard. Pulmonary/Chest: Effort normal and breath sounds normal. No respiratory distress. He has no wheezes. He has no rhonchi. He has no rales.  No chest wall tenderness No tenderness on thoracic back  Abdominal: Soft. Normal appearance. There is no tenderness.  Musculoskeletal: Normal range of motion.  Neurological: He is alert and oriented to person, place, and time. No sensory deficit. Gait normal.  Strength 4/5 with bilateral hand grasp. Hand grasp  produces tremor in right arm and hand. Strength 5/5 in lower extremity. Obvious random spasms in right forearm.   Skin: Skin is warm, dry and intact. No lesion and no rash noted.  Psychiatric: He has a normal mood and affect. His speech is normal and behavior is normal. Thought content normal.   BP 122/80 mmHg  Pulse 79  Temp(Src) 98.3 F (36.8 C) (Oral)  Resp 16  Ht 6\' 1"  (1.854 m)  Wt 232 lb 9.6 oz (105.507 kg)  BMI 30.69 kg/m2  SpO2 99%  Results for orders placed or performed in visit on 01/21/15  POCT CBC  Result Value Ref Range   WBC 7.1 4.6 - 10.2 K/uL   Lymph, poc 2.7 0.6 - 3.4   POC LYMPH PERCENT 38.4 10 - 50 %L   MID (cbc) 0.3 0 - 0.9   POC MID % 3.6 0 - 12 %M   POC Granulocyte 4.1 2 - 6.9   Granulocyte percent 58.0 37 - 80 %G   RBC 5.52 4.69 - 6.13 M/uL   Hemoglobin 15.6 14.1 - 18.1 g/dL   HCT, POC 48.2 43.5 - 53.7 %   MCV 87.4 80 - 97 fL   MCH, POC 28.2 27 - 31.2 pg   MCHC 32.3 31.8 - 35.4 g/dL   RDW, POC 13.4 %   Platelet Count, POC 272 142 - 424 K/uL   MPV 7.9 0 - 99.8 fL  POCT glucose (manual entry)  Result Value Ref Range   POC Glucose 84 70 - 99 mg/dl  POCT urinalysis dipstick  Result Value Ref Range   Color, UA yellow    Clarity, UA clear    Glucose, UA neg    Bilirubin, UA neg    Ketones, UA trace    Spec Grav, UA 1.025    Blood, UA small    pH, UA 5.5    Protein, UA neg    Urobilinogen, UA 0.2    Nitrite, UA neg    Leukocytes, UA Negative Negative  POCT UA - Microscopic Only  Result Value Ref Range   WBC, Ur, HPF, POC neg    RBC, urine, microscopic neg    Bacteria, U Microscopic neg    Mucus, UA positive    Epithelial cells, urine per micros neg    Crystals, Ur, HPF, POC neg    Casts, Ur, LPF, POC neg    Yeast, UA neg     Assessment and Plan:  1. Dystonia Refilled valium. He only takes once every couple weeks and helps during flares. He has a follow up appt with neurologist in November.  - POCT CBC - diazepam (VALIUM) 5 MG  tablet; Take 1 tablet (5 mg total) by mouth  every 8 (eight) hours as needed for muscle spasms.  Dispense: 15 tablet; Refill: 1  2. Hematuria Hematuria 3 months ago during ED visit. No RBCs on urine dip today. - POCT urinalysis dipstick - POCT UA - Microscopic Only  3. Elevated glucose Glucose elevated to 130 during ED visit 3 months ago. Normal today at 84. - POCT glucose (manual entry) - POCT glycosylated hemoglobin (Hb A1C)  Return with further problems/concerns.   Benjaman Pott Drenda Freeze, MHS [facility]  01/24/2015

## 2015-01-30 ENCOUNTER — Encounter: Payer: Self-pay | Admitting: Family Medicine

## 2015-01-30 NOTE — Progress Notes (Signed)
Agree with management by Bennett Scrape, PA-C.  However we do not have an explanation for microhematuria in May of this year.  mychart message to pt with options- urology referral vs recheck of [place] a couple more times this year

## 2015-06-02 HISTORY — PX: OTHER SURGICAL HISTORY: SHX169

## 2015-08-13 ENCOUNTER — Telehealth: Payer: Self-pay | Admitting: Family Medicine

## 2015-08-13 NOTE — Telephone Encounter (Signed)
Spoke with patient about his flu shot.  He is going to [facility] for a doctors appointment and he will receive it there.

## 2016-04-16 DIAGNOSIS — G249 Dystonia, unspecified: Secondary | ICD-10-CM | POA: Diagnosis not present

## 2016-04-16 DIAGNOSIS — Z79899 Other long term (current) drug therapy: Secondary | ICD-10-CM | POA: Diagnosis not present

## 2016-04-27 DIAGNOSIS — G249 Dystonia, unspecified: Secondary | ICD-10-CM | POA: Diagnosis not present

## 2016-04-27 DIAGNOSIS — M79601 Pain in right arm: Secondary | ICD-10-CM | POA: Diagnosis not present

## 2016-08-19 IMAGING — CR DG CHEST 1V PORT
1 series · 1 of 1 positions shown · non-contrast
Comparison: Thoracic spine films on 07/03/2013

CLINICAL DATA: Chest tightness and shortness breath.

EXAM:
PORTABLE CHEST - 1 VIEW

[AP]
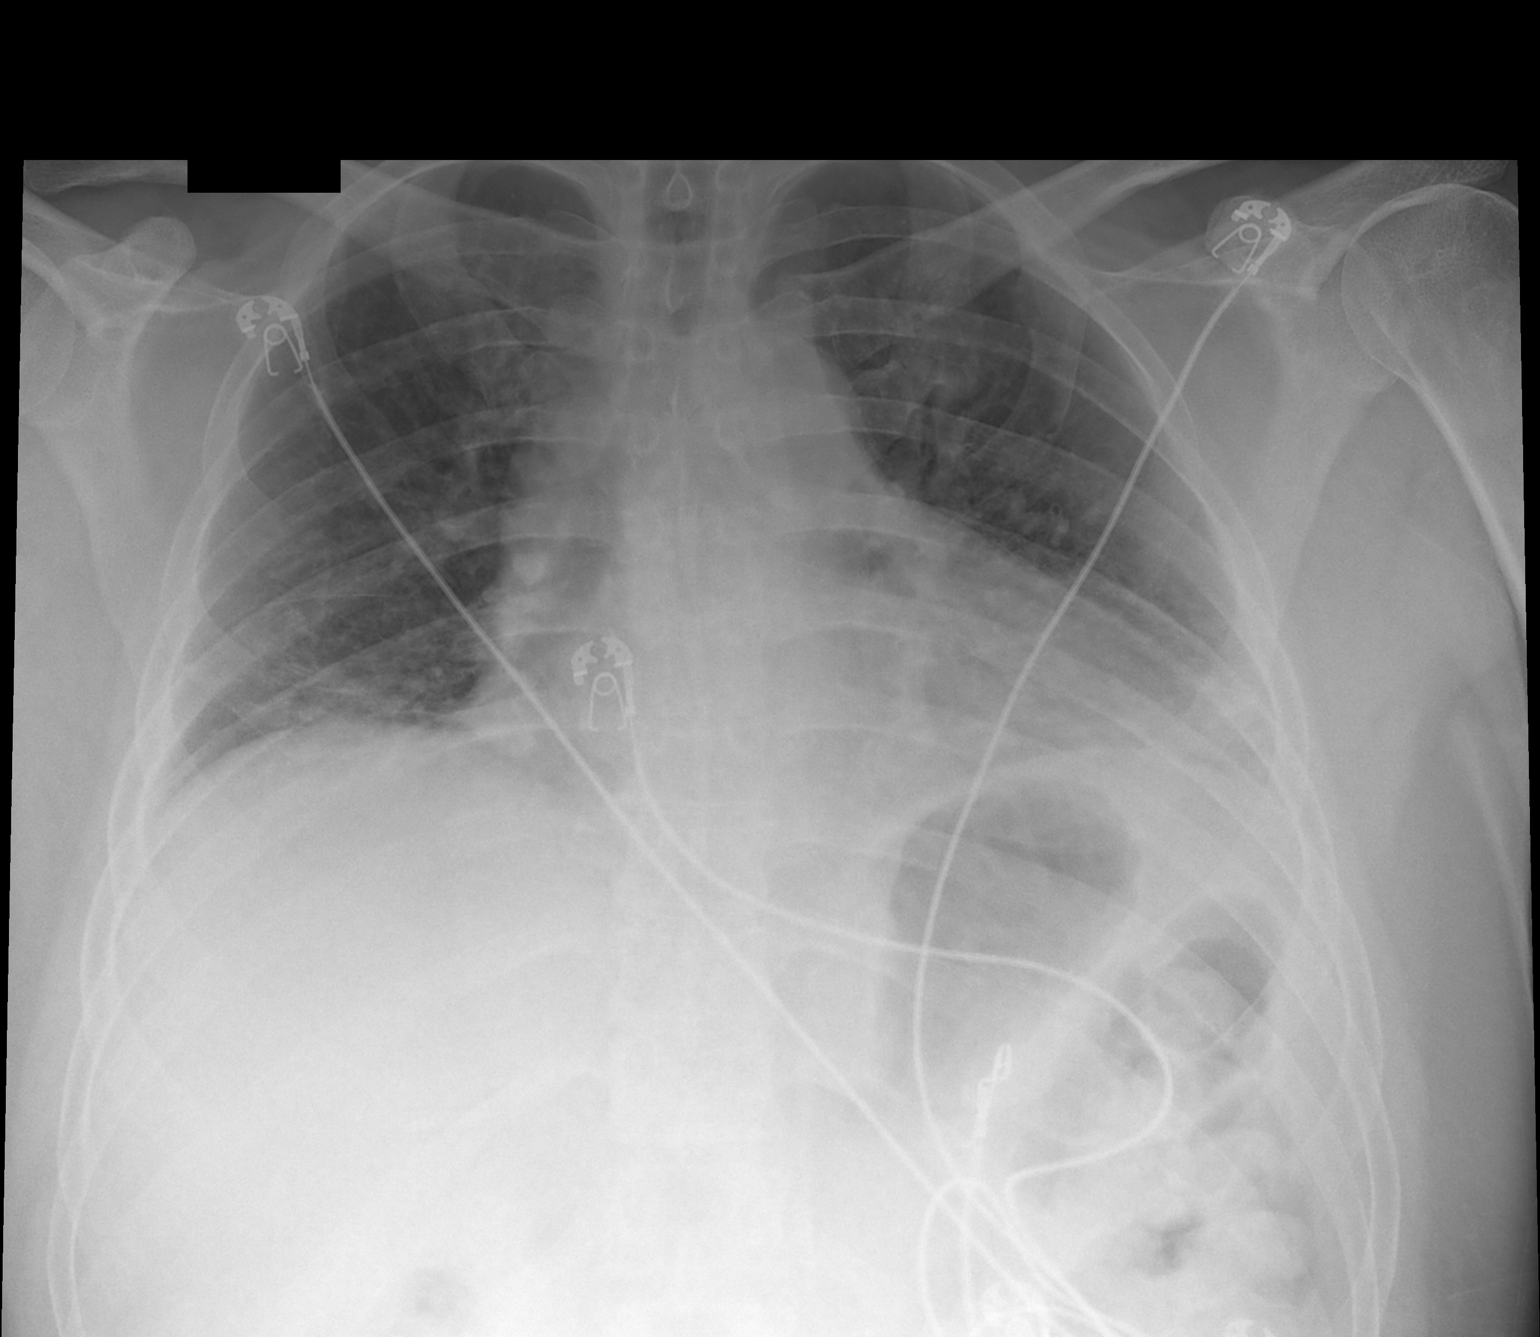

[1 of 1 positions shown; findings below may reference images not displayed]

FINDINGS: Lung volumes are very low with bibasilar atelectasis present. There
is no evidence of pulmonary edema, consolidation, pneumothorax,
nodule or pleural fluid. The heart size is normal. Visualized bony
structures are unremarkable.
IMPRESSION: Low lung volumes with bibasilar atelectasis.

## 2017-04-19 ENCOUNTER — Encounter: Payer: Self-pay | Admitting: Family Medicine

## 2017-04-19 ENCOUNTER — Ambulatory Visit (INDEPENDENT_AMBULATORY_CARE_PROVIDER_SITE_OTHER): Payer: BLUE CROSS/BLUE SHIELD | Admitting: Family Medicine

## 2017-04-19 VITALS — BP 100/80 | HR 89 | Temp 97.7°F | Ht 71.75 in | Wt 218.0 lb

## 2017-04-19 DIAGNOSIS — G249 Dystonia, unspecified: Secondary | ICD-10-CM | POA: Diagnosis not present

## 2017-04-19 DIAGNOSIS — R319 Hematuria, unspecified: Secondary | ICD-10-CM | POA: Diagnosis not present

## 2017-04-19 DIAGNOSIS — R05 Cough: Secondary | ICD-10-CM | POA: Diagnosis not present

## 2017-04-19 DIAGNOSIS — R059 Cough, unspecified: Secondary | ICD-10-CM

## 2017-04-19 LAB — POCT URINALYSIS DIPSTICK
BILIRUBIN UA: NEGATIVE
Glucose, UA: NEGATIVE
KETONES UA: NEGATIVE
LEUKOCYTES UA: NEGATIVE
Nitrite, UA: NEGATIVE
Protein, UA: 15
RBC UA: NEGATIVE
Spec Grav, UA: >=1.03 — AB (ref 1.010–1.025)
Urobilinogen, UA: 0.2 E.U./dL
pH, UA: 6 (ref 5.0–8.0)

## 2017-04-19 MED ORDER — BENZONATATE 200 MG PO CAPS: 200.0000 mg | ORAL_CAPSULE | Freq: Two times a day (BID) | ORAL | 0 refills | Status: DC | PRN

## 2017-04-19 MED ORDER — IPRATROPIUM BROMIDE 0.06 % NA SOLN: 2.0000 | Freq: Four times a day (QID) | NASAL | 12 refills | Status: DC

## 2017-04-19 MED ORDER — AZITHROMYCIN 250 MG PO TABS: ORAL_TABLET | ORAL | 0 refills | Status: DC

## 2017-04-19 NOTE — Assessment & Plan Note (Addendum)
Mild resting tremor in right upper extremity, however neurological exam otherwise normal.  Based on review of his neurologist's notes and prior workup he does not have a clear etiology for his symptoms -his brain MRI was normal and his blood work was normal.    Controlled substance contract reviewed, signed, and scanned in chart today.  On line database reviewed without red flags.  Patient does not need a refill on this today, however I agreed to provide this to him in the future if his neurological status remained stable.  He will be following up with neurology at [facility] at least once or twice yearly.

## 2017-04-19 NOTE — Progress Notes (Signed)
Subjective:  David Silva is a 41 y.o. male who presents today with a chief complaint of cough and to establish care.   HPI:  Cough, acute issue  Symptoms started about 2 weeks ago.  Worsened over that time.  Associated symptoms include sore throat, rhinorrhea, postnasal drip.  His wife and daughter have been sick with similar symptoms.  Wife was recently diagnosed with pneumonia and started on biotic.  He is also been using Benadryl cough drops with minimal relief.  He did have one episode where he vomited about 20 times 3 days ago.  No nausea or vomiting since then.  No fevers.  No chills.  No rash.  Hematuria, new problem Patient reports that they found blood in his urine about a year ago.  Was told that it was nothing to worry about but that he should have it followed up.  No dysuria.  No frequent urination.  Does not notice any red streaks or red tinge to his urine currently.  Dystonia, chronic problem, new this provider Several year history.  Has been managed by the movement disorder clinic at [facility].  He is currently on Valium and Artane.  He would like for [place] to provide his Valium instead of him having to go to [facility] every 3-6 months.  Patient reports that he has taken maybe 30 doses of this over the past year.  Currently has a mild tremor in his right arm, but has not noticed any other symptoms.  He will be following up with his neurologist next month.  Summary/review of office notes at [facility] neurology: Patient initially seen on 05/03/2014 for initial consult.  At that time was having about 6 months of tremor and neck pain.  Prior to the consult he had a EMG of right arm and leg which were normal.  He also had a normal C-spine MRI.  He was diagnosed with focal dystonia and had workup including brain MRI (see objective section for review/summary).  He was started on Artane for dystonic symptoms.  He did well on this dose, however he was started on Valium about a year ago  to help with the dystonia.  ROS: Per HPI, otherwise a 14 point review of systems was performed and was negative  PMH:  The following were reviewed and entered/updated in epic: Past Medical History:  Diagnosis Date  . Allergy    Patient Active Problem List   Diagnosis Date Noted  . Dystonia 01/21/2015   Past Surgical History:  Procedure Laterality Date  . Dystonia  2017    Family History  Problem Relation Age of Onset  . Diabetes Maternal Grandmother   . Heart disease Maternal Grandmother     Medications- reviewed and updated Current Outpatient Medications  Medication Sig Dispense Refill  . diazepam (VALIUM) 5 MG tablet Take 1 tablet (5 mg total) by mouth every 8 (eight) hours as needed for muscle spasms. 15 tablet 1  . trihexyphenidyl (ARTANE) 2 MG tablet Take 3 mg by mouth.    Marland Kitchen azithromycin (ZITHROMAX) 250 MG tablet Take 2 tabs day 1, then 1 tab daily 6 each 0  . benzonatate (TESSALON) 200 MG capsule Take 1 capsule (200 mg total) 2 (two) times daily as needed by mouth for cough. 20 capsule 0  . ipratropium (ATROVENT) 0.06 % nasal spray Place 2 sprays 4 (four) times daily into both nostrils. 15 mL 12   No current facility-administered medications for this visit.     Allergies-reviewed  and updated Allergies  Allergen Reactions  . Sulfate Rash    Social History   Socioeconomic History  . Marital status: Single    Spouse name: None  . Number of children: 0  . Years of education: college  . Highest education level: None  Social Needs  . Financial resource strain: None  . Food insecurity - worry: None  . Food insecurity - inability: None  . Transportation needs - medical: None  . Transportation needs - non-medical: None  Occupational History    Employer: [employer]    Comment: [employer]  Tobacco Use  . Smoking status: Former Smoker    Last attempt to quit: 06/01/2013    Years since quitting: 3.8  . Smokeless tobacco: Never Used  . Tobacco  comment: electronic cigs. daily  Substance and Sexual Activity  . Alcohol use: Yes    Comment: 4 drinks per  yearly  . Drug use: No  . Sexual activity: Yes  Other Topics Concern  . None  Social History Narrative   Patient resides with a friend   Objective:  Physical Exam: BP 100/80   Pulse 89   Temp 97.7 F (36.5 C)   Ht 5' 11.75" (1.822 m)   Wt 218 lb (98.9 kg)   SpO2 99%   BMI 29.77 kg/m   Gen: NAD, resting comfortably HEENT: TMs are clear effusions bilaterally.  Maxillary frontal sinuses transilluminate normally.  Oropharynx erythematous without exudate.  No lymphadenopathy. CV: RRR with no murmurs appreciated Pulm: NWOB, CTAB with no crackles, wheezes, or rhonchi GI: Normal bowel sounds present. Soft, Nontender, Nondistended. MSK: No edema, cyanosis, or clubbing noted Skin: Warm, dry Neuro: Cranial nerves II through XII intact.  Strength and sensation intact in upper and lower extremities.  Mild, fine resting tremor in right upper extremity. Psych: Normal affect and thought content  Review/summary of neurological workup: Brain MRI 05/21/2014: No abnormality.  Glutamic acid decarboxylase 05/03/2014: normal Vitamin D 05/03/2014: Normal  MMA 05/03/2014: Normal  Results for orders placed or performed in visit on 04/19/17 (from the past 24 hour(s))  POCT urinalysis dipstick     Status: Abnormal   Collection Time: 04/19/17 11:49 AM  Result Value Ref Range   Color, UA Yellow    Clarity, UA Clear    Glucose, UA Negative    Bilirubin, UA Negative    Ketones, UA Negative    Spec Grav, UA >=1.030 (A) 1.010 - 1.025   Blood, UA Negative    pH, UA 6.0 5.0 - 8.0   Protein, UA 15 mg/dL    Urobilinogen, UA 0.2 0.2 or 1.0 E.U./dL   Nitrite, UA Negative    Leukocytes, UA Negative Negative   Assessment/Plan:  Dystonia Mild resting tremor in right upper extremity, however neurological exam otherwise normal.  Based on review of his neurologist's notes and prior workup he does not  have a clear etiology for his symptoms -his brain MRI was normal and his blood work was normal.    Controlled substance contract reviewed, signed, and scanned in chart today.  On line database reviewed without red flags.  Patient does not need a refill on this today, however I agreed to provide this to him in the future if his neurological status remained stable.  He will be following up with neurology at [facility] at least once or twice yearly.  Hematuria, new issue UA today without blood.  Will call patient with results no further workup needed.  Cough, acute issue No  overt signs of bacterial infection today, however given the patient's symptoms have persisted for 2 weeks, will start empiric antibiotics.  Azithromycin sent into his pharmacy.  Prescription for Atrovent nasal spray and Tessalon also sent in.  Encouraged good hydration.  Recommended Tylenol and/or Motrin as needed for pain and fever.  Return precautions reviewed.  Follow-up as needed.  Preventative healthcare Patient will return soon for his complete physical exam.   Kurstyn Larios M. Jerline Pain, MD 04/19/2017 12:34 PM

## 2017-04-19 NOTE — Progress Notes (Signed)
UA without blood. Please inform patient.  David Silva. Jerline Pain, MD 04/19/2017 12:25 PM

## 2017-04-19 NOTE — Patient Instructions (Signed)
Start the Z-Pak and Atrovent.  Use Tessalon as needed for cough  Please let us know if your symptoms worsen or do not improve in the next few days.  Please stay well-hydrated.  You can take Tylenol and/or Motrin as needed for pain.  Come back soon for your physical exam.  Take care, Dr. Jerline Pain

## 2017-04-27 ENCOUNTER — Ambulatory Visit: Payer: Self-pay | Admitting: *Deleted

## 2017-04-27 ENCOUNTER — Ambulatory Visit: Payer: BLUE CROSS/BLUE SHIELD | Admitting: Family Medicine

## 2017-04-27 ENCOUNTER — Encounter: Payer: Self-pay | Admitting: Family Medicine

## 2017-04-27 VITALS — BP 138/91 | HR 80 | Temp 98.0°F | Ht 71.75 in | Wt 218.0 lb

## 2017-04-27 DIAGNOSIS — R05 Cough: Secondary | ICD-10-CM | POA: Diagnosis not present

## 2017-04-27 DIAGNOSIS — R030 Elevated blood-pressure reading, without diagnosis of hypertension: Secondary | ICD-10-CM

## 2017-04-27 DIAGNOSIS — R059 Cough, unspecified: Secondary | ICD-10-CM

## 2017-04-27 MED ORDER — METHYLPREDNISOLONE ACETATE 40 MG/ML IJ SUSP
40.0000 mg | Freq: Once | INTRAMUSCULAR | Status: AC
  Administered 2017-04-27: 40 mg via INTRAMUSCULAR

## 2017-04-27 MED ORDER — AMOXICILLIN-POT CLAVULANATE 875-125 MG PO TABS: 1.0000 | ORAL_TABLET | Freq: Two times a day (BID) | ORAL | 0 refills | Status: DC

## 2017-04-27 MED ORDER — PREDNISONE 50 MG PO TABS: ORAL_TABLET | ORAL | 0 refills | Status: DC

## 2017-04-27 NOTE — Telephone Encounter (Signed)
  Reason for Disposition . Vomiting a prescription medication  Answer Assessment - Initial Assessment Questions 1. VOMITING SEVERITY: "How many times have you vomited in the past 24 hours?"     - MILD:  1 - 2 times/day    - MODERATE: 3 - 5 times/day, decreased oral intake without significant weight loss or symptoms of dehydration    - SEVERE: 6 or more times/day, vomits everything or nearly everything, with significant weight loss, symptoms of dehydration      Severe approx 10+ times 2. ONSET: "When did the vomiting begin?"     Started after taking the meds at 2pm 3. FLUIDS: "What fluids or food have you vomited up today?" "Have you been able to keep any fluids down?"     Fluids unable to eat solid food 4. ABDOMINAL PAIN: "Are your having any abdominal pain?" If yes : "How bad is it and what does it feel like?" (e.g., crampy, dull, intermittent, constant)      Yes,  5. DIARRHEA: "Is there any diarrhea?" If so, ask: "How many times today?"      Yes, intermittent 6. CONTACTS: "Is there anyone else in the family with the same symptoms?"      No 7. CAUSE: "What do you think is causing your vomiting?"     Amoxicillin 8. HYDRATION STATUS: "Any signs of dehydration?" (e.g., dry mouth [not only dry lips], too weak to stand) "When did you last urinate?"     Urinating normally 9. OTHER SYMPTOMS: "Do you have any other symptoms?" (e.g., fever, headache, vertigo, vomiting blood or coffee grounds, recent head injury)    No  Protocols used: VOMITING-A-AH  Pt was seen for an appt today and was prescribed Amoxicillin,which he took around 2pm and became sick afterwards with vomiting and diarrhea.Advised pt to go to the ER for treatment of vomiting. Vomiting could be heard by the triage nurse while on the phone with pt's wife, and pt's wife states he has been vomiting for hours. Pt's wife states that the pt had the same reaction when prescribed Amoxicillin in the past, but at the time they thought the  vomiting was due to food poisoning.

## 2017-04-27 NOTE — Progress Notes (Signed)
    Subjective:  David Silva is a 41 y.o. male who presents today with a chief complaint of cough.   HPI:  Cough, established problem, worsening Patient seen 1 week ago with 2 weeks of cough.  He was empirically treated with azithromycin.  States that he was doing better for the first 3-4 days, however noticed that he became significantly worse starting a few days ago.  He is now having subjective fevers and intermittent chills.  Cough is getting worse.  Some shortness of breath.  No chest pain.  Symptoms are worse at night when lying flat.  No other obvious alleviating or aggravating factors noted.  ROS: Per HPI  PMH: Smoking history reviewed.  Former smoker.  Objective:  Physical Exam: BP (!) 138/91   Pulse 80   Temp 98 F (36.7 C) (Oral)   Ht 5' 11.75" (1.822 m)   Wt 218 lb (98.9 kg)   SpO2 98%   BMI 29.77 kg/m   Gen: NAD, ill-appearing but nontoxic HEENT: Moist mucous membranes.  TMs clear bilaterally.  No lymphadenopathy. CV: RRR with no murmurs appreciated Pulm: NWOB, CTAB with no crackles, wheezes, or rhonchi  Assessment/Plan:  Cough Concerning that patient has had a re-worsening of his symptoms despite azithromycin.  He is afebrile today and his lung exam is clear, however he does appear ill.  Given the re-worsening of his symptoms and overall duration approximately 3 weeks, it is reasonable to give another course of antibiotics today.  Start Augmentin for 1 week course.  We will also give IM Depo-Medrol 40 mg today and a 5-day burst of prednisone starting tomorrow.  Return precautions reviewed.  Follow-up as needed.  Elevated blood pressure reading Well-controlled last visit.  Elevated today in setting of acute illness.  Patient will return in 3-4 weeks for follow-up.  No indication for medications today.  David Silva. Jerline Pain, MD 04/27/2017 12:23 PM

## 2017-04-28 ENCOUNTER — Telehealth: Payer: Self-pay | Admitting: Family Medicine

## 2017-04-28 NOTE — Telephone Encounter (Signed)
FYI.  Patient was advised by triage to go to the ED but I do not see that he did that.

## 2017-04-28 NOTE — Telephone Encounter (Signed)
Pt called asking if he was going to be prescribed another antibiotic or if he needed to come in for a visit. Pt states he is still having abdominal cramps and diarrhea, but is no longer vomiting.

## 2017-04-29 NOTE — Telephone Encounter (Signed)
Please see message and advise 

## 2017-04-29 NOTE — Telephone Encounter (Signed)
He needs to be seen if he is having severe cramps and diarrhea.

## 2017-04-29 NOTE — Telephone Encounter (Signed)
Please call pt and schedule an appointment per dr. Jerline Pain.

## 2017-04-30 ENCOUNTER — Encounter: Payer: Self-pay | Admitting: Family Medicine

## 2017-04-30 ENCOUNTER — Ambulatory Visit: Payer: BLUE CROSS/BLUE SHIELD | Admitting: Family Medicine

## 2017-04-30 VITALS — BP 110/78 | HR 95 | Temp 98.6°F | Wt 215.2 lb

## 2017-04-30 DIAGNOSIS — R1013 Epigastric pain: Secondary | ICD-10-CM

## 2017-04-30 DIAGNOSIS — R112 Nausea with vomiting, unspecified: Secondary | ICD-10-CM | POA: Diagnosis not present

## 2017-04-30 DIAGNOSIS — R197 Diarrhea, unspecified: Secondary | ICD-10-CM

## 2017-04-30 DIAGNOSIS — J320 Chronic maxillary sinusitis: Secondary | ICD-10-CM

## 2017-04-30 LAB — COMPREHENSIVE METABOLIC PANEL
ALT: 205 U/L — ABNORMAL HIGH (ref 0–53)
AST: 56 U/L — ABNORMAL HIGH (ref 0–37)
Albumin: 4.7 g/dL (ref 3.5–5.2)
Alkaline Phosphatase: 80 U/L (ref 39–117)
BUN: 14 mg/dL (ref 6–23)
CO2: 26 mEq/L (ref 19–32)
Calcium: 10 mg/dL (ref 8.4–10.5)
Chloride: 107 mEq/L (ref 96–112)
Creatinine, Ser: 0.86 mg/dL (ref 0.40–1.50)
GFR: 104.5 mL/min (ref >60.00)
Glucose, Bld: 126 mg/dL — ABNORMAL HIGH (ref 70–99)
Potassium: 4.3 mEq/L (ref 3.5–5.1)
Sodium: 139 mEq/L (ref 135–145)
Total Bilirubin: 0.7 mg/dL (ref 0.2–1.2)
Total Protein: 7 g/dL (ref 6.0–8.3)

## 2017-04-30 LAB — CBC WITH DIFFERENTIAL/PLATELET
Basophils Absolute: 0 10*3/uL (ref 0.0–0.1)
Basophils Relative: 0.1 % (ref 0.0–3.0)
Eosinophils Absolute: 0 10*3/uL (ref 0.0–0.7)
Eosinophils Relative: 0 % (ref 0.0–5.0)
HCT: 48 % (ref 39.0–52.0)
Hemoglobin: 15.9 g/dL (ref 13.0–17.0)
Lymphocytes Relative: 11.5 % — ABNORMAL LOW (ref 12.0–46.0)
Lymphs Abs: 1.5 10*3/uL (ref 0.7–4.0)
MCHC: 33.2 g/dL (ref 30.0–36.0)
MCV: 91.8 fl (ref 78.0–100.0)
Monocytes Absolute: 0.7 10*3/uL (ref 0.1–1.0)
Monocytes Relative: 5.7 % (ref 3.0–12.0)
Neutro Abs: 10.8 10*3/uL — ABNORMAL HIGH (ref 1.4–7.7)
Neutrophils Relative %: 82.7 % — ABNORMAL HIGH (ref 43.0–77.0)
Platelets: 326 10*3/uL (ref 150.0–400.0)
RBC: 5.23 Mil/uL (ref 4.22–5.81)
RDW: 13.4 % (ref 11.5–15.5)
WBC: 13 10*3/uL — ABNORMAL HIGH (ref 4.0–10.5)

## 2017-04-30 LAB — LIPASE: Lipase: 23 U/L (ref 11.0–59.0)

## 2017-04-30 MED ORDER — OMEPRAZOLE 40 MG PO CPDR: 40.0000 mg | DELAYED_RELEASE_CAPSULE | Freq: Every day | ORAL | 3 refills | Status: DC

## 2017-04-30 MED ORDER — ONDANSETRON HCL 4 MG PO TABS: 4.0000 mg | ORAL_TABLET | Freq: Three times a day (TID) | ORAL | 0 refills | Status: DC | PRN

## 2017-04-30 MED ORDER — LEVOFLOXACIN 500 MG PO TABS: 500.0000 mg | ORAL_TABLET | Freq: Every day | ORAL | 0 refills | Status: DC

## 2017-05-01 ENCOUNTER — Encounter: Payer: Self-pay | Admitting: Family Medicine

## 2017-05-01 NOTE — Progress Notes (Signed)
David Silva is a 41 y.o. male is here the issues below.   History of Present Illness:   HPI:   1. Chronic maxillary sinusitis. Previously Rx Augmentin, Prednisone. Unfortunately, N/V/D after starting Augmentin. ENT ROS: negative for - epistaxis, oral lesions, sneezing, sore throat, tinnitus, vertigo, visual changes or vocal changes.   2. Dyspepsia. Associated with epigastric pain, with mild radiation to RUQ. No back pain. No treatment.   3. Nausea vomiting and diarrhea. N/V multiple times within the first 24 hours of Augmentin. Loose stools that have improved. Gastrointestinal ROS: negative for - blood in stools, constipation, hematemesis, melena, stool incontinence or swallowing difficulty/pain.   Health Maintenance Due  Topic Date Due  . HIV Screening  12/26/1991  . TETANUS/TDAP  12/26/1995  . INFLUENZA VACCINE  12/30/2016   Depression screen [facility] 2/9 04/19/2017 01/21/2015  Decreased Interest 0 0  Down, Depressed, Hopeless 0 0  PHQ - 2 Score 0 0   PMHx, SurgHx, SocialHx, FamHx, Medications, and Allergies were reviewed in the Visit Navigator and updated as appropriate.   Patient Active Problem List   Diagnosis Date Noted  . Dystonia 01/21/2015   Social History   Tobacco Use  . Smoking status: Former Smoker    Last attempt to quit: 06/01/2013    Years since quitting: 3.9  . Smokeless tobacco: Never Used  . Tobacco comment: electronic cigs. daily  Substance Use Topics  . Alcohol use: Yes    Comment: 4 drinks per  yearly  . Drug use: No   Current Medications and Allergies:   .  diazepam (VALIUM) 5 MG tablet, Take 1 tablet (5 mg total) by mouth every 8 (eight) hours as needed for muscle spasms., Disp: 15 tablet, Rfl: 1 .  ipratropium (ATROVENT) 0.06 % nasal spray, Place 2 sprays 4 (four) times daily into both nostrils., Disp: 15 mL, Rfl: 12 .  predniSONE (DELTASONE) 50 MG tablet, Take 1 pill daily for 5 days, Disp: 5 tablet, Rfl: 0 .  trihexyphenidyl (ARTANE) 2  MG tablet, Take 3 mg by mouth., Disp: , Rfl:     Allergies  Allergen Reactions  . Amoxicillin Nausea And Vomiting  . Sulfate Rash   Review of Systems   Pertinent items are noted in the HPI. Otherwise, ROS is negative.  Vitals:   Vitals:   04/30/17 0844  BP: 110/78  Pulse: 95  Temp: 98.6 F (37 C)  TempSrc: Oral  SpO2: 98%  Weight: 215 lb 3.2 oz (97.6 kg)     Body mass index is 29.39 kg/m.   Physical Exam:   Physical Exam  Constitutional: He is oriented to person, place, and time. He appears well-developed and well-nourished. No distress.  HENT:  Head: Normocephalic and atraumatic.  Right Ear: External ear normal. A middle ear effusion is present.  Left Ear: External ear normal. A middle ear effusion is present.  Nose: Mucosal edema present. Right sinus exhibits maxillary sinus tenderness and frontal sinus tenderness.  Mouth/Throat: Oropharynx is clear and moist. No oral lesions.  Eyes: Conjunctivae and EOM are normal. Pupils are equal, round, and reactive to light.  Neck: Normal range of motion. Neck supple.  Cardiovascular: Normal rate, regular rhythm, normal heart sounds and intact distal pulses.  Pulmonary/Chest: Effort normal and breath sounds normal.  Abdominal: Soft. Bowel sounds are normal. He exhibits no distension. There is no hepatosplenomegaly. There is tenderness in the epigastric area.  Musculoskeletal: Normal range of motion.  Neurological: He is alert and oriented to  person, place, and time.  Skin: Skin is warm and dry.  Psychiatric: He has a normal mood and affect. His behavior is normal. Judgment and thought content normal.  Nursing note and vitals reviewed.   Assessment and Plan:   Jaques was seen today for diarrhea, emesis and sinusitis.  Diagnoses and all orders for this visit:  Chronic maxillary sinusitis Comments: Pain c/w bacterial sinusitis. Already taking Prednisone, which has helped. Will change to Levaquin as below. Red flags  reviewed. Orders: -     levofloxacin (LEVAQUIN) 500 MG tablet; Take 1 tablet (500 mg total) by mouth daily.  Dyspepsia Comments: Patient likely with gastritis after N/V multiple times. No red flags. PPI for 2-6 weeks.  Orders: -     omeprazole (PRILOSEC) 40 MG capsule; Take 1 capsule (40 mg total) by mouth daily.  Nausea vomiting and diarrhea Comments: Thought to be due to Augmentin. Will add to Allergy list as intolerance. Zofran as needed. Labs pennding.  Orders: -     ondansetron (ZOFRAN) 4 MG tablet; Take 1 tablet (4 mg total) by mouth every 8 (eight) hours as needed for nausea or vomiting. -     CBC with Differential/Platelet -     Comprehensive metabolic panel -     Lipase    . Reviewed expectations re: course of current medical issues. . Discussed self-management of symptoms. . Outlined signs and symptoms indicating need for more acute intervention. . Patient verbalized understanding and all questions were answered. Marland Kitchen Health Maintenance issues including appropriate healthy diet, exercise, and smoking avoidance were discussed with patient. . See orders for this visit as documented in the electronic medical record. . Patient received an After Visit Summary.  Briscoe Deutscher, DO [place] 05/01/2017  Future Appointments  Date Time Provider [place]  05/28/2017  8:00 AM Briscoe Deutscher, DO [facility]

## 2017-05-11 ENCOUNTER — Encounter: Payer: Self-pay | Admitting: Family Medicine

## 2017-05-12 ENCOUNTER — Ambulatory Visit: Payer: BLUE CROSS/BLUE SHIELD | Admitting: Family Medicine

## 2017-05-12 ENCOUNTER — Ambulatory Visit (INDEPENDENT_AMBULATORY_CARE_PROVIDER_SITE_OTHER): Payer: BLUE CROSS/BLUE SHIELD

## 2017-05-12 ENCOUNTER — Encounter: Payer: Self-pay | Admitting: Family Medicine

## 2017-05-12 VITALS — BP 118/68 | HR 106 | Temp 98.1°F | Ht 71.75 in | Wt 219.6 lb

## 2017-05-12 DIAGNOSIS — R059 Cough, unspecified: Secondary | ICD-10-CM

## 2017-05-12 DIAGNOSIS — R05 Cough: Secondary | ICD-10-CM

## 2017-05-12 DIAGNOSIS — R079 Chest pain, unspecified: Secondary | ICD-10-CM | POA: Diagnosis not present

## 2017-05-12 MED ORDER — ALBUTEROL SULFATE HFA 108 (90 BASE) MCG/ACT IN AERS: 2.0000 | INHALATION_SPRAY | Freq: Four times a day (QID) | RESPIRATORY_TRACT | 0 refills | Status: DC | PRN

## 2017-05-12 MED ORDER — ALBUTEROL SULFATE (2.5 MG/3ML) 0.083% IN NEBU
2.5000 mg | INHALATION_SOLUTION | Freq: Once | RESPIRATORY_TRACT | Status: AC
  Administered 2017-05-12: 2.5 mg via RESPIRATORY_TRACT

## 2017-05-12 MED ORDER — CETIRIZINE HCL 10 MG PO TABS: 10.0000 mg | ORAL_TABLET | Freq: Every day | ORAL | 11 refills | Status: DC

## 2017-05-12 MED ORDER — MONTELUKAST SODIUM 10 MG PO TABS
10.0000 mg | ORAL_TABLET | Freq: Every day | ORAL | 3 refills | Status: DC
Start: 2017-05-12 — End: 2017-12-31

## 2017-05-12 NOTE — Progress Notes (Signed)
David Silva is a 41 y.o. male here for an acute visit.  History of Present Illness:   HPI: See Assessment and Plan section for Problem Based Charting of issues discussed today.  PMHx, SurgHx, SocialHx, Medications, and Allergies were reviewed in the Visit Navigator and updated as appropriate.  Current Medications:   .  diazepam (VALIUM) 5 MG tablet, Take 1 tablet (5 mg total) by mouth every 8 (eight) hours as needed for muscle spasms., Disp: 15 tablet, Rfl: 1 .  ipratropium (ATROVENT) 0.06 % nasal spray, Place 2 sprays 4 (four) times daily into both nostrils., Disp: 15 mL, Rfl: 12 .  omeprazole (PRILOSEC) 40 MG capsule, Take 1 capsule (40 mg total) by mouth daily., Disp: 30 capsule, Rfl: 3 .  ondansetron (ZOFRAN) 4 MG tablet, Take 1 tablet (4 mg total) by mouth every 8 (eight) hours as needed for nausea or vomiting., Disp: 20 tablet, Rfl: 0 .  trihexyphenidyl (ARTANE) 2 MG tablet, Take 3 mg by mouth., Disp: , Rfl:    Allergies  Allergen Reactions  . Amoxicillin Nausea And Vomiting  . Sulfate Rash   Review of Systems:   Pertinent items are noted in the HPI. Otherwise, ROS is negative.  Vitals:   Vitals:   05/12/17 1323  BP: 118/68  Pulse: (!) 106  Temp: 98.1 F (36.7 C)  TempSrc: Oral  SpO2: 98%  Weight: 219 lb 9.6 oz (99.6 kg)  Height: 5' 11.75" (1.822 m)     Body mass index is 29.99 kg/m.   Physical Exam:   Physical Exam  Constitutional: He is oriented to person, place, and time. He appears well-developed and well-nourished.  HENT:  Head: Normocephalic and atraumatic.  Right Ear: External ear normal.  Left Ear: External ear normal.  Nose: Mucosal edema and rhinorrhea present.  Mouth/Throat: Posterior oropharyngeal erythema present.  Eyes: Conjunctivae and EOM are normal. Pupils are equal, round, and reactive to light.  Neck: Normal range of motion. Neck supple.  Cardiovascular: Normal rate, regular rhythm, normal heart sounds and intact distal  pulses.  Pulmonary/Chest: Effort normal. He has rhonchi.  Abdominal: Soft. Bowel sounds are normal.  Musculoskeletal: Normal range of motion.  Neurological: He is alert and oriented to person, place, and time.  Skin: Skin is warm and dry.  Psychiatric: He has a normal mood and affect. His behavior is normal. Judgment and thought content normal.  Nursing note and vitals reviewed.   Results for orders placed or performed in visit on 04/30/17  CBC with Differential/Platelet  Result Value Ref Range   WBC 13.0 (H) 4.0 - 10.5 K/uL   RBC 5.23 4.22 - 5.81 Mil/uL   Hemoglobin 15.9 13.0 - 17.0 g/dL   HCT 48.0 39.0 - 52.0 %   MCV 91.8 78.0 - 100.0 fl   MCHC 33.2 30.0 - 36.0 g/dL   RDW 13.4 11.5 - 15.5 %   Platelets 326.0 150.0 - 400.0 K/uL   Neutrophils Relative % 82.7 (H) 43.0 - 77.0 %   Lymphocytes Relative 11.5 (L) 12.0 - 46.0 %   Monocytes Relative 5.7 3.0 - 12.0 %   Eosinophils Relative 0.0 0.0 - 5.0 %   Basophils Relative 0.1 0.0 - 3.0 %   Neutro Abs 10.8 (H) 1.4 - 7.7 K/uL   Lymphs Abs 1.5 0.7 - 4.0 K/uL   Monocytes Absolute 0.7 0.1 - 1.0 K/uL   Eosinophils Absolute 0.0 0.0 - 0.7 K/uL   Basophils Absolute 0.0 0.0 - 0.1 K/uL  Comprehensive metabolic  panel  Result Value Ref Range   Sodium 139 135 - 145 mEq/L   Potassium 4.3 3.5 - 5.1 mEq/L   Chloride 107 96 - 112 mEq/L   CO2 26 19 - 32 mEq/L   Glucose, Bld 126 (H) 70 - 99 mg/dL   BUN 14 6 - 23 mg/dL   Creatinine, Ser 0.86 0.40 - 1.50 mg/dL   Total Bilirubin 0.7 0.2 - 1.2 mg/dL   Alkaline Phosphatase 80 39 - 117 U/L   AST 56 (H) 0 - 37 U/L   ALT 205 (H) 0 - 53 U/L   Total Protein 7.0 6.0 - 8.3 g/dL   Albumin 4.7 3.5 - 5.2 g/dL   Calcium 10.0 8.4 - 10.5 mg/dL   GFR 104.50 >60.00 mL/min  Lipase  Result Value Ref Range   Lipase 23.0 11.0 - 59.0 U/L   EXAM: CHEST  2 VIEW  COMPARISON:  None.  FINDINGS: Lungs are clear. Heart size and pulmonary vascularity are normal. No adenopathy. No pneumothorax. No bone  lesions.  IMPRESSION: No edema or consolidation.  Assessment and Plan:   Irving was seen today for uri.  Diagnoses and all orders for this visit:  Cough Comments: Concern for allergy-induced cough. Will trial below medications.  Orders: -     DG Chest 2 View -     montelukast (SINGULAIR) 10 MG tablet; Take 1 tablet (10 mg total) by mouth at bedtime. -     albuterol (PROVENTIL HFA;VENTOLIN HFA) 108 (90 Base) MCG/ACT inhaler; Inhale 2 puffs into the lungs every 6 (six) hours as needed for wheezing or shortness of breath. -     cetirizine (ZYRTEC) 10 MG tablet; Take 1 tablet (10 mg total) by mouth daily. -     albuterol (PROVENTIL) (2.5 MG/3ML) 0.083% nebulizer solution 2.5 mg once in office   . Reviewed expectations re: course of current medical issues. . Discussed self-management of symptoms. . Outlined signs and symptoms indicating need for more acute intervention. . Patient verbalized understanding and all questions were answered. Marland Kitchen Health Maintenance issues including appropriate healthy diet, exercise, and smoking avoidance were discussed with patient. . See orders for this visit as documented in the electronic medical record. . Patient received an After Visit Summary.  Briscoe Deutscher, DO [place] 05/16/2017  Future Appointments  Date Time Provider [place]  05/28/2017  8:00 AM Briscoe Deutscher, DO [facility]

## 2017-05-14 ENCOUNTER — Encounter: Payer: Self-pay | Admitting: Surgical

## 2017-05-17 ENCOUNTER — Encounter: Payer: Self-pay | Admitting: Surgical

## 2017-05-20 ENCOUNTER — Telehealth: Payer: Self-pay | Admitting: Family Medicine

## 2017-05-20 NOTE — Telephone Encounter (Signed)
Please notify patient as soon as possible if a work note can be completed and prepared for the patient for pick up as soon as possible.   Copied from [place] #[id number]. 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 Patient stated that tomorrow's date must appear on the letter. Patient needs the letter today. Patient also needs a phone call to come by and pick up the letter today. Patient cannot return to work without the note.       Thank You!!!

## 2017-05-21 ENCOUNTER — Encounter: Payer: BLUE CROSS/BLUE SHIELD | Admitting: Family Medicine

## 2017-05-21 NOTE — Telephone Encounter (Signed)
Spoke with patient and he stated that this was taken care of on 05/17/17. He is not sure where this message came from. A note was printed off and given to the patient on that day.

## 2017-05-28 ENCOUNTER — Ambulatory Visit (INDEPENDENT_AMBULATORY_CARE_PROVIDER_SITE_OTHER): Payer: BLUE CROSS/BLUE SHIELD | Admitting: Family Medicine

## 2017-05-28 ENCOUNTER — Encounter: Payer: Self-pay | Admitting: Family Medicine

## 2017-05-28 VITALS — BP 110/68 | HR 69 | Temp 97.9°F | Wt 226.0 lb

## 2017-05-28 DIAGNOSIS — Z23 Encounter for immunization: Secondary | ICD-10-CM

## 2017-05-28 DIAGNOSIS — Z0000 Encounter for general adult medical examination without abnormal findings: Secondary | ICD-10-CM | POA: Diagnosis not present

## 2017-05-28 DIAGNOSIS — G249 Dystonia, unspecified: Secondary | ICD-10-CM

## 2017-05-28 DIAGNOSIS — Z114 Encounter for screening for human immunodeficiency virus [HIV]: Secondary | ICD-10-CM | POA: Diagnosis not present

## 2017-05-28 LAB — COMPREHENSIVE METABOLIC PANEL
ALT: 60 U/L — ABNORMAL HIGH (ref 0–53)
AST: 37 U/L (ref 0–37)
Albumin: 4.4 g/dL (ref 3.5–5.2)
Alkaline Phosphatase: 74 U/L (ref 39–117)
BUN: 18 mg/dL (ref 6–23)
CO2: 24 mEq/L (ref 19–32)
Calcium: 9.1 mg/dL (ref 8.4–10.5)
Chloride: 108 mEq/L (ref 96–112)
Creatinine, Ser: 0.88 mg/dL (ref 0.40–1.50)
GFR: 101.73 mL/min (ref >60.00)
Glucose, Bld: 88 mg/dL (ref 70–99)
Potassium: 4 mEq/L (ref 3.5–5.1)
Sodium: 140 mEq/L (ref 135–145)
Total Bilirubin: 0.5 mg/dL (ref 0.2–1.2)
Total Protein: 6.6 g/dL (ref 6.0–8.3)

## 2017-05-28 LAB — LIPID PANEL
Cholesterol: 165 mg/dL (ref 0–200)
HDL: 36.2 mg/dL — ABNORMAL LOW (ref >39.00)
LDL Cholesterol: 106 mg/dL — ABNORMAL HIGH (ref 0–99)
NonHDL: 128.65
Total CHOL/HDL Ratio: 5
Triglycerides: 114 mg/dL (ref 0.0–149.0)
VLDL: 22.8 mg/dL (ref 0.0–40.0)

## 2017-05-28 LAB — CBC WITH DIFFERENTIAL/PLATELET
Basophils Absolute: 0 10*3/uL (ref 0.0–0.1)
Basophils Relative: 0.7 % (ref 0.0–3.0)
Eosinophils Absolute: 0.1 10*3/uL (ref 0.0–0.7)
Eosinophils Relative: 1.7 % (ref 0.0–5.0)
HCT: 45.7 % (ref 39.0–52.0)
Hemoglobin: 15.3 g/dL (ref 13.0–17.0)
Lymphocytes Relative: 31.9 % (ref 12.0–46.0)
Lymphs Abs: 2.1 10*3/uL (ref 0.7–4.0)
MCHC: 33.5 g/dL (ref 30.0–36.0)
MCV: 91.4 fl (ref 78.0–100.0)
Monocytes Absolute: 0.5 10*3/uL (ref 0.1–1.0)
Monocytes Relative: 8.2 % (ref 3.0–12.0)
Neutro Abs: 3.9 10*3/uL (ref 1.4–7.7)
Neutrophils Relative %: 57.5 % (ref 43.0–77.0)
Platelets: 293 10*3/uL (ref 150.0–400.0)
RBC: 5 Mil/uL (ref 4.22–5.81)
RDW: 13.6 % (ref 11.5–15.5)
WBC: 6.7 10*3/uL (ref 4.0–10.5)

## 2017-05-28 MED ORDER — DIAZEPAM 5 MG PO TABS: 5.0000 mg | ORAL_TABLET | Freq: Two times a day (BID) | ORAL | 0 refills | Status: DC | PRN

## 2017-05-28 MED ORDER — TETANUS-DIPHTH-ACELL PERTUSSIS 5-2.5-18.5 LF-MCG/0.5 IM SUSP
0.5000 mL | Freq: Once | INTRAMUSCULAR | Status: AC
  Administered 2017-05-28: 0.5 mL via INTRAMUSCULAR

## 2017-05-28 NOTE — Progress Notes (Signed)
Subjective:    David Silva is a 41 y.o. male who presents today for his Complete Annual Exam. He feels fairly well. He reports exercising regularly. He reports he is sleeping fairly well.   Health Maintenance Due  Topic Date Due  . HIV Screening  12/26/1991  . TETANUS/TDAP  12/26/1995  . INFLUENZA VACCINE  12/30/2016   PMHx, SurgHx, SocialHx, Medications, and Allergies were reviewed in the Visit Navigator and updated as appropriate.   Past Medical History:  Diagnosis Date  . Allergy     Past Surgical History:  Procedure Laterality Date  . Dystonia  2017    Family History  Problem Relation Age of Onset  . Diabetes Maternal Grandmother   . Heart disease Maternal Grandmother    Social History   Tobacco Use  . Smoking status: Former Smoker    Last attempt to quit: 06/01/2013    Years since quitting: 3.9  . Smokeless tobacco: Never Used  . Tobacco comment: electronic cigs. daily  Substance Use Topics  . Alcohol use: Yes    Comment: 4 drinks per  yearly  . Drug use: No   Review of Systems:   Pertinent items are noted in the HPI. Otherwise, ROS is negative.  Objective:   Vitals:   05/28/17 0757  BP: 110/68  Pulse: 69  Temp: 97.9 F (36.6 C)  SpO2: 98%   Body mass index is 30.87 kg/m.  General Appearance:  Alert, cooperative, no distress, appears stated age  Head:  Normocephalic, without obvious abnormality, atraumatic  Eyes:  PERRL, conjunctiva/corneas clear, EOM's intact, fundi benign, both eyes       Ears:  Normal TM's and external ear canals, both ears  Nose: Nares normal, septum midline, mucosa normal, no drainage    or sinus tenderness  Throat: Lips, mucosa, and tongue normal; teeth and gums normal  Neck: Supple, symmetrical, trachea midline, no adenopathy; thyroid:  No enlargement/tenderness/nodules; no carotit bruit or JVD  Back:   Symmetric, no curvature, ROM normal, no CVA tenderness  Lungs:   Clear to auscultation bilaterally,  respirations unlabored  Chest wall:  No tenderness or deformity  Heart:  Regular rate and rhythm, S1 and S2 normal, no murmur, rub   or gallop  Abdomen:   Soft, non-tender, bowel sounds active all four quadrants, no masses, no organomegaly  Extremities: Extremities normal, atraumatic, no cyanosis or edema  Prostate: Not done.   Skin: Skin color, texture, turgor normal, no rashes or lesions  Lymph nodes: Cervical, supraclavicular, and axillary nodes normal  Neurologic: CNII-XII grossly intact. Normal strength, sensation and reflexes throughout   Assessment/Plan:   David Silva was seen today for annual exam.  Diagnoses and all orders for this visit:  Physical exam -     CBC with Differential/Platelet -     Comprehensive metabolic panel -     Lipid panel -     Tdap (BOOSTRIX) injection 0.5 mL  Need for immunization against influenza -     HIV antibody -     Flu Vaccine QUAD 36+ mos IM  Screening for HIV (human immunodeficiency virus)  Dystonia Comments: Patient asked me to take over his Valium prescription.  He has a history of dystonia.  These Valium keeps him out of the emergency room.  He uses one 5 mg tablet once per week on average.  I will go ahead and give him a prescription for 60 to last the year. Orders: -     diazepam (VALIUM) 5  MG tablet; Take 1 tablet (5 mg total) by mouth every 12 (twelve) hours as needed for anxiety.   Patient Counseling: [x]   Nutrition: Stressed importance of moderation in sodium/caffeine intake, saturated fat and cholesterol, caloric balance, sufficient intake of fresh fruits, vegetables, and fiber.  [x]   Stressed the importance of regular exercise.   []   Substance Abuse: Discussed cessation/primary prevention of tobacco, alcohol, or other drug use; driving or other dangerous activities under the influence; availability of treatment for abuse.   [x]   Injury prevention: Discussed safety belts, safety helmets, smoke detector, smoking near bedding or  upholstery.   []   Sexuality: Discussed sexually transmitted diseases, partner selection, use of condoms, avoidance of unintended pregnancy and contraceptive alternatives.   [x]   Dental health: Discussed importance of regular tooth brushing, flossing, and dental visits.  [x]   Health maintenance and immunizations reviewed. Please refer to Health maintenance section.   Briscoe Deutscher, DO [place]

## 2017-05-29 LAB — HIV ANTIBODY (ROUTINE TESTING W REFLEX): HIV 1&2 Ab, 4th Generation: NONREACTIVE

## 2017-08-27 ENCOUNTER — Ambulatory Visit: Payer: BLUE CROSS/BLUE SHIELD | Admitting: Family Medicine

## 2017-08-27 ENCOUNTER — Encounter: Payer: Self-pay | Admitting: Family Medicine

## 2017-08-27 VITALS — BP 122/78 | HR 80 | Temp 98.3°F | Wt 226.0 lb

## 2017-08-27 DIAGNOSIS — G249 Dystonia, unspecified: Secondary | ICD-10-CM | POA: Diagnosis not present

## 2017-08-27 DIAGNOSIS — E782 Mixed hyperlipidemia: Secondary | ICD-10-CM | POA: Diagnosis not present

## 2017-08-27 DIAGNOSIS — R748 Abnormal levels of other serum enzymes: Secondary | ICD-10-CM

## 2017-08-27 NOTE — Progress Notes (Signed)
David Silva is a 42 y.o. male is here for follow up.  History of Present Illness:   HPI:   1. Dystonia. No concerns today.    2. Mixed hyperlipidemia.   Trying to exercise on a regular basis? [x]   YES  []   NO Diet Compliance: compliant most of the time. Cardiovascular ROS: no chest pain or dyspnea on exertion.   Lipids:    Component Value Date/Time   CHOL 165 05/28/2017 0830   TRIG 114.0 05/28/2017 0830   HDL 36.20 (L) 05/28/2017 0830   VLDL 22.8 05/28/2017 0830   CHOLHDL 5 05/28/2017 0830    The 10-year ASCVD risk score Mikey Bussing DC Jr., et al., 2013) is: 1.1%   Values used to calculate the score:     Age: 70 years     Sex: Male     Is Non-Hispanic African American: No     Diabetic: No     Tobacco smoker: No     Systolic Blood Pressure: 353 mmHg     Is BP treated: No     HDL Cholesterol: 36.2 mg/dL     Total Cholesterol: 165 mg/dL   3. Elevated liver enzymes.   Lab Results  Component Value Date   ALT 60 (H) 05/28/2017   AST 37 05/28/2017   ALKPHOS 74 05/28/2017   BILITOT 0.5 05/28/2017      There are no preventive care reminders to display for this patient.   Depression screen [facility] 2/9 04/19/2017 01/21/2015  Decreased Interest 0 0  Down, Depressed, Hopeless 0 0  PHQ - 2 Score 0 0   PMHx, SurgHx, SocialHx, FamHx, Medications, and Allergies were reviewed in the Visit Navigator and updated as appropriate.   Patient Active Problem List   Diagnosis Date Noted  . Mixed hyperlipidemia 08/27/2017  . Elevated liver enzymes 08/27/2017  . Dystonia 01/21/2015   Social History   Tobacco Use  . Smoking status: Former Smoker    Last attempt to quit: 06/01/2013    Years since quitting: 4.2  . Smokeless tobacco: Never Used  . Tobacco comment: electronic cigs. daily  Substance Use Topics  . Alcohol use: Yes    Comment: 4 drinks per  yearly  . Drug use: No   Current Medications and Allergies:   .  albuterol (PROVENTIL HFA;VENTOLIN HFA) 108 (90 Base)  MCG/ACT inhaler, Inhale 2 puffs into the lungs every 6 (six) hours as needed for wheezing or shortness of breath., Disp: 1 Inhaler, Rfl: 0 .  cetirizine (ZYRTEC) 10 MG tablet, Take 1 tablet (10 mg total) by mouth daily., Disp: 30 tablet, Rfl: 11 .  diazepam (VALIUM) 5 MG tablet, Take 1 tablet (5 mg total) by mouth every 12 (twelve) hours as needed for anxiety., Disp: 60 tablet, Rfl: 0 .  montelukast (SINGULAIR) 10 MG tablet, Take 1 tablet (10 mg total) by mouth at bedtime., Disp: 30 tablet, Rfl: 3 .  omeprazole (PRILOSEC) 40 MG capsule, Take 1 capsule (40 mg total) by mouth daily., Disp: 30 capsule, Rfl: 3 .  ondansetron (ZOFRAN) 4 MG tablet, Take 1 tablet (4 mg total) by mouth every 8 (eight) hours as needed for nausea or vomiting., Disp: 20 tablet, Rfl: 0 .  trihexyphenidyl (ARTANE) 2 MG tablet, Take 3 mg by mouth., Disp: , Rfl:   Allergies  Allergen Reactions  . Amoxicillin Nausea And Vomiting  . Sulfate Rash   Review of Systems   Pertinent items are noted in the HPI. Otherwise, ROS is negative.  Vitals:   Vitals:   08/27/17 1508  BP: 122/78  Pulse: 80  Temp: 98.3 F (36.8 C)  TempSrc: Oral  SpO2: 99%  Weight: 226 lb (102.5 kg)     Body mass index is 30.87 kg/m. Physical Exam:   Physical Exam  Constitutional: He is oriented to person, place, and time. He appears well-developed and well-nourished. No distress.  HENT:  Head: Normocephalic and atraumatic.  Right Ear: External ear normal.  Left Ear: External ear normal.  Nose: Nose normal.  Mouth/Throat: Oropharynx is clear and moist.  Eyes: Pupils are equal, round, and reactive to light. Conjunctivae and EOM are normal.  Neck: Normal range of motion. Neck supple.  Cardiovascular: Normal rate, regular rhythm, normal heart sounds and intact distal pulses.  Pulmonary/Chest: Effort normal and breath sounds normal.  Abdominal: Soft. Bowel sounds are normal.  Musculoskeletal: Normal range of motion.  Neurological: He is alert  and oriented to person, place, and time.  Skin: Skin is warm and dry.  Psychiatric: He has a normal mood and affect. His behavior is normal. Judgment and thought content normal.  Nursing note and vitals reviewed.  Results for orders placed or performed in visit on 05/28/17  CBC with Differential/Platelet  Result Value Ref Range   WBC 6.7 4.0 - 10.5 K/uL   RBC 5.00 4.22 - 5.81 Mil/uL   Hemoglobin 15.3 13.0 - 17.0 g/dL   HCT 45.7 39.0 - 52.0 %   MCV 91.4 78.0 - 100.0 fl   MCHC 33.5 30.0 - 36.0 g/dL   RDW 13.6 11.5 - 15.5 %   Platelets 293.0 150.0 - 400.0 K/uL   Neutrophils Relative % 57.5 43.0 - 77.0 %   Lymphocytes Relative 31.9 12.0 - 46.0 %   Monocytes Relative 8.2 3.0 - 12.0 %   Eosinophils Relative 1.7 0.0 - 5.0 %   Basophils Relative 0.7 0.0 - 3.0 %   Neutro Abs 3.9 1.4 - 7.7 K/uL   Lymphs Abs 2.1 0.7 - 4.0 K/uL   Monocytes Absolute 0.5 0.1 - 1.0 K/uL   Eosinophils Absolute 0.1 0.0 - 0.7 K/uL   Basophils Absolute 0.0 0.0 - 0.1 K/uL  Comprehensive metabolic panel  Result Value Ref Range   Sodium 140 135 - 145 mEq/L   Potassium 4.0 3.5 - 5.1 mEq/L   Chloride 108 96 - 112 mEq/L   CO2 24 19 - 32 mEq/L   Glucose, Bld 88 70 - 99 mg/dL   BUN 18 6 - 23 mg/dL   Creatinine, Ser 0.88 0.40 - 1.50 mg/dL   Total Bilirubin 0.5 0.2 - 1.2 mg/dL   Alkaline Phosphatase 74 39 - 117 U/L   AST 37 0 - 37 U/L   ALT 60 (H) 0 - 53 U/L   Total Protein 6.6 6.0 - 8.3 g/dL   Albumin 4.4 3.5 - 5.2 g/dL   Calcium 9.1 8.4 - 10.5 mg/dL   GFR 101.73 >60.00 mL/min  Lipid panel  Result Value Ref Range   Cholesterol 165 0 - 200 mg/dL   Triglycerides 114.0 0.0 - 149.0 mg/dL   HDL 36.20 (L) >39.00 mg/dL   VLDL 22.8 0.0 - 40.0 mg/dL   LDL Cholesterol 106 (H) 0 - 99 mg/dL   Total CHOL/HDL Ratio 5    NonHDL 128.65   HIV antibody  Result Value Ref Range   HIV 1&2 Ab, 4th Generation NON-REACTIVE NON-REACTI    Assessment and Plan:   Diagnoses and all orders for this  visit:  Dystonia  Comments: Doing well. Still has plenty of Valium. Orders: -     Comprehensive metabolic panel  Mixed hyperlipidemia Comments: Elevated. Low risk (ASCVD 1%). Will start statin if liver function elevated, evidence of fatty liver. Orders: -     Comprehensive metabolic panel  Elevated liver enzymes Comments: Recheck today. Liver US if still elevated.  Orders: -     Comprehensive metabolic panel   . Reviewed expectations re: course of current medical issues. . Discussed self-management of symptoms. . Outlined signs and symptoms indicating need for more acute intervention. . Patient verbalized understanding and all questions were answered. Marland Kitchen Health Maintenance issues including appropriate healthy diet, exercise, and smoking avoidance were discussed with patient. . See orders for this visit as documented in the electronic medical record. . Patient received an After Visit Summary.  Briscoe Deutscher, DO [place] 08/27/2017  No future appointments.

## 2017-08-28 LAB — COMPREHENSIVE METABOLIC PANEL
AG Ratio: 2 (calc) (ref 1.0–2.5)
ALT: 26 U/L (ref 9–46)
AST: 22 U/L (ref 10–40)
Albumin: 4.7 g/dL (ref 3.6–5.1)
Alkaline phosphatase (APISO): 68 U/L (ref 40–115)
BUN: 11 mg/dL (ref 7–25)
CO2: 26 mmol/L (ref 20–32)
Calcium: 9.5 mg/dL (ref 8.6–10.3)
Chloride: 105 mmol/L (ref 98–110)
Creat: 0.86 mg/dL (ref 0.60–1.35)
Globulin: 2.4 g/dL (calc) (ref 1.9–3.7)
Glucose, Bld: 81 mg/dL (ref 65–99)
Potassium: 4.1 mmol/L (ref 3.5–5.3)
Sodium: 140 mmol/L (ref 135–146)
Total Bilirubin: 0.6 mg/dL (ref 0.2–1.2)
Total Protein: 7.1 g/dL (ref 6.1–8.1)

## 2017-12-22 ENCOUNTER — Other Ambulatory Visit: Payer: Self-pay | Admitting: Family Medicine

## 2017-12-22 DIAGNOSIS — G249 Dystonia, unspecified: Secondary | ICD-10-CM

## 2017-12-22 NOTE — Telephone Encounter (Signed)
Last o/v 08-27-17 No f/u  Last script given 05-28-2018 #60 no refills Called patient and l/m to call office to make f/u app

## 2017-12-23 ENCOUNTER — Telehealth: Payer: Self-pay | Admitting: *Deleted

## 2017-12-23 NOTE — Telephone Encounter (Signed)
Copied from [place] [id number]. Topic: Quick Communication - Office Called Patient >> Dec 22, 2017  4:03 PM Neva Seat wrote: Pt returned missed call.  Please call pt back if needed.

## 2017-12-23 NOTE — Telephone Encounter (Signed)
Please call pt and schedule appointment per Dr. Juleen China. Rx was sent to pharmacy no further refills till seen.

## 2017-12-30 NOTE — Progress Notes (Signed)
David Silva is a 42 y.o. male is here for follow up.  History of Present Illness:   [name], CMA acting as scribe for Dr. Briscoe Deutscher.   [id number] in office for follow up. He has called in for refill on his valium and told he needed follow up appointment. He has taken more frequently due to muscle spasms. He has history of them due to his dystonia. He has been taking Valium 1 1/2 daily when he is having bad spasms. He has not taken any in the last few days.   Health Maintenance Due  Topic Date Due  . INFLUENZA VACCINE  12/30/2017   Depression screen [facility] 2/9 04/19/2017 01/21/2015  Decreased Interest 0 0  Down, Depressed, Hopeless 0 0  PHQ - 2 Score 0 0   PMHx, SurgHx, SocialHx, FamHx, Medications, and Allergies were reviewed in the Visit Navigator and updated as appropriate.   Patient Active Problem List   Diagnosis Date Noted  . Mixed hyperlipidemia 08/27/2017  . Elevated liver enzymes 08/27/2017  . Dystonia 01/21/2015   Social History   Tobacco Use  . Smoking status: Former Smoker    Last attempt to quit: 06/01/2013    Years since quitting: 4.5  . Smokeless tobacco: Never Used  . Tobacco comment: electronic cigs. daily  Substance Use Topics  . Alcohol use: Yes    Comment: 4 drinks per  yearly  . Drug use: No   Current Medications and Allergies:   Current Outpatient Medications:  .  albuterol (PROVENTIL HFA;VENTOLIN HFA) 108 (90 Base) MCG/ACT inhaler, Inhale 2 puffs into the lungs every 6 (six) hours as needed for wheezing or shortness of breath., Disp: 1 Inhaler, Rfl: 0 .  cetirizine (ZYRTEC) 10 MG tablet, Take 1 tablet (10 mg total) by mouth daily., Disp: 30 tablet, Rfl: 11 .  diazepam (VALIUM) 5 MG tablet, TAKE 1 TABLET (5 MG TOTAL) BY MOUTH EVERY 12 (TWELVE) HOURS AS NEEDED FOR ANXIETY., Disp: 60 tablet, Rfl: 0 .  montelukast (SINGULAIR) 10 MG tablet, Take 1 tablet (10 mg total) by mouth at bedtime., Disp: 30 tablet, Rfl: 3 .  omeprazole  (PRILOSEC) 40 MG capsule, Take 1 capsule (40 mg total) by mouth daily., Disp: 30 capsule, Rfl: 3 .  ondansetron (ZOFRAN) 4 MG tablet, Take 1 tablet (4 mg total) by mouth every 8 (eight) hours as needed for nausea or vomiting., Disp: 20 tablet, Rfl: 0 .  trihexyphenidyl (ARTANE) 2 MG tablet, Take 3 mg by mouth., Disp: , Rfl:    Allergies  Allergen Reactions  . Amoxicillin Nausea And Vomiting  . Sulfate Rash   Review of Systems   Pertinent items are noted in the HPI. Otherwise, ROS is negative.  Vitals:   Vitals:   12/31/17 1545  BP: 110/74  Pulse: 70  Temp: 98.6 F (37 C)  TempSrc: Oral  SpO2: 99%  Weight: 230 lb 3.2 oz (104.4 kg)  Height: 5\' 11"  (1.803 m)     Body mass index is 32.11 kg/m.  Physical Exam:   Physical Exam  Constitutional: He is oriented to person, place, and time. He appears well-developed and well-nourished. No distress.  HENT:  Head: Normocephalic and atraumatic.  Right Ear: External ear normal.  Left Ear: External ear normal.  Nose: Nose normal.  Mouth/Throat: Oropharynx is clear and moist.  Eyes: Pupils are equal, round, and reactive to light. Conjunctivae and EOM are normal.  Neck: Normal range of motion. Neck supple.  Cardiovascular: Normal rate, regular  rhythm, normal heart sounds and intact distal pulses.  Pulmonary/Chest: Effort normal and breath sounds normal.  Abdominal: Soft. Bowel sounds are normal.  Musculoskeletal: Normal range of motion.  Neurological: He is alert and oriented to person, place, and time.  Skin: Skin is warm and dry.  Psychiatric: He has a normal mood and affect. His behavior is normal. Judgment and thought content normal.  Nursing note and vitals reviewed.  Assessment and Plan:   Sofia was seen today for follow-up.  Diagnoses and all orders for this visit:  Dystonia Comments: Will trial baclofen q hs to see if it decreases the amount of prn Valium. Orders: -     baclofen (LIORESAL) 20 MG tablet; Take 1  tablet (20 mg total) by mouth 3 (three) times daily as needed for muscle spasms.    . Reviewed expectations re: course of current medical issues. . Discussed self-management of symptoms. . Outlined signs and symptoms indicating need for more acute intervention. . Patient verbalized understanding and all questions were answered. Marland Kitchen Health Maintenance issues including appropriate healthy diet, exercise, and smoking avoidance were discussed with patient. . See orders for this visit as documented in the electronic medical record. . Patient received an After Visit Summary.  Briscoe Deutscher, DO [place] 01/01/2018  No future appointments.  CMA served as Education administrator during this visit. History, Physical, and Plan performed by medical provider. The above documentation has been reviewed and is accurate and complete. Briscoe Deutscher, D.O.

## 2017-12-31 ENCOUNTER — Ambulatory Visit: Payer: BLUE CROSS/BLUE SHIELD | Admitting: Family Medicine

## 2017-12-31 ENCOUNTER — Encounter: Payer: Self-pay | Admitting: Family Medicine

## 2017-12-31 VITALS — BP 110/74 | HR 70 | Temp 98.6°F | Ht 71.0 in | Wt 230.2 lb

## 2017-12-31 DIAGNOSIS — G249 Dystonia, unspecified: Secondary | ICD-10-CM

## 2017-12-31 MED ORDER — BACLOFEN 20 MG PO TABS: 20.0000 mg | ORAL_TABLET | Freq: Three times a day (TID) | ORAL | 0 refills | Status: DC | PRN

## 2017-12-31 NOTE — Patient Instructions (Signed)
TRY TONIC WATER AND MAGNESIUM 400 MG NIGHTLY.

## 2018-03-31 ENCOUNTER — Other Ambulatory Visit: Payer: Self-pay | Admitting: Family Medicine

## 2018-03-31 DIAGNOSIS — G249 Dystonia, unspecified: Secondary | ICD-10-CM

## 2018-04-01 NOTE — Telephone Encounter (Signed)
Controlled Substance Database is down, last OV 12/31/17. Last refill 12/22/17

## 2018-06-07 ENCOUNTER — Ambulatory Visit: Payer: BLUE CROSS/BLUE SHIELD | Admitting: Family Medicine

## 2018-06-07 ENCOUNTER — Encounter: Payer: Self-pay | Admitting: Family Medicine

## 2018-06-07 VITALS — BP 108/72 | HR 78 | Temp 98.1°F | Ht 71.0 in | Wt 233.0 lb

## 2018-06-07 DIAGNOSIS — Z23 Encounter for immunization: Secondary | ICD-10-CM | POA: Diagnosis not present

## 2018-06-07 DIAGNOSIS — E782 Mixed hyperlipidemia: Secondary | ICD-10-CM

## 2018-06-07 DIAGNOSIS — G249 Dystonia, unspecified: Secondary | ICD-10-CM | POA: Diagnosis not present

## 2018-06-07 MED ORDER — BACLOFEN 20 MG PO TABS: ORAL_TABLET | ORAL | 0 refills | Status: DC

## 2018-06-07 NOTE — Progress Notes (Signed)
David Silva is a 43 y.o. male is here for follow up.  History of Present Illness:   David Silva, CMA acting as scribe for Dr. Briscoe Deutscher.   HPI: Patient in office for follow up. At last visit he was given baclofen to see if it would help decrease use of valium. He is taking baclofen at night and only taking the valium twice a week. He had a huge improvement when the change was made. "he felt better than he had in five years;" improvement lasted about a month. Effects have weaned down to now he can not tell a change from before.   There are no preventive care reminders to display for this patient.   Depression screen [facility] 2/9 06/07/2018 04/19/2017 01/21/2015  Decreased Interest 0 0 0  Down, Depressed, Hopeless 0 0 0  PHQ - 2 Score 0 0 0  Altered sleeping 0 - -  Tired, decreased energy 1 - -  Change in appetite 1 - -  Feeling bad or failure about yourself  0 - -  Trouble concentrating 0 - -  Moving slowly or fidgety/restless 0 - -  Suicidal thoughts 0 - -  PHQ-9 Score 2 - -  Difficult doing work/chores Not difficult at all - -   PMHx, SurgHx, SocialHx, FamHx, Medications, and Allergies were reviewed in the Visit Navigator and updated as appropriate.   Patient Active Problem List   Diagnosis Date Noted  . Mixed hyperlipidemia 08/27/2017  . Dystonia 01/21/2015   Social History   Tobacco Use  . Smoking status: Former Smoker    Last attempt to quit: 06/01/2013    Years since quitting: 5.0  . Smokeless tobacco: Never Used  . Tobacco comment: electronic cigs. daily  Substance Use Topics  . Alcohol use: Yes    Comment: 4 drinks per  yearly  . Drug use: No   Current Medications and Allergies:   .  baclofen (LIORESAL) 20 MG tablet, TAKE ONE TABLET BY MOUTH THREE TIMES A DAY AS NEEDED MUSCLE SPASMS, Disp: 90 tablet, Rfl: 0 .  diazepam (VALIUM) 5 MG tablet, TAKE 1 TABLET (5 MG TOTAL) BY MOUTH EVERY 12 (TWELVE) HOURS AS NEEDED FOR ANXIETY., Disp: 60 tablet, Rfl:  0   Allergies  Allergen Reactions  . Amoxicillin Nausea And Vomiting  . Sulfate Rash   Review of Systems   Pertinent items are noted in the HPI. Otherwise, a complete ROS is negative.  Vitals:   Vitals:   06/07/18 1312  BP: 108/72  Pulse: 78  Temp: 98.1 F (36.7 C)  TempSrc: Oral  SpO2: 97%  Weight: 233 lb (105.7 kg)  Height: 5\' 11"  (1.803 m)     Body mass index is 32.5 kg/m.  Physical Exam:   Physical Exam Vitals signs and nursing note reviewed.  Constitutional:      General: He is not in acute distress.    Appearance: He is well-developed.  HENT:     Head: Normocephalic and atraumatic.     Right Ear: External ear normal.     Left Ear: External ear normal.     Nose: Nose normal.  Eyes:     Conjunctiva/sclera: Conjunctivae normal.     Pupils: Pupils are equal, round, and reactive to light.  Neck:     Musculoskeletal: Normal range of motion and neck supple.  Cardiovascular:     Rate and Rhythm: Normal rate and regular rhythm.     Heart sounds: Normal heart sounds.  Pulmonary:  Effort: Pulmonary effort is normal.     Breath sounds: Normal breath sounds.  Abdominal:     General: Bowel sounds are normal.     Palpations: Abdomen is soft.  Musculoskeletal: Normal range of motion.  Skin:    General: Skin is warm and dry.  Neurological:     Mental Status: He is alert and oriented to person, place, and time.  Psychiatric:        Behavior: Behavior normal.        Thought Content: Thought content normal.        Judgment: Judgment normal.    Assessment and Plan:   [name] was seen today for follow-up.  Diagnoses and all orders for this visit:  Dystonia Comments: Will increase Baclofen to BID dosing.  Orders: -     baclofen (LIORESAL) 20 MG tablet; TAKE ONE TABLET BY MOUTH THREE TIMES A DAY AS NEEDED MUSCLE SPASMS  Mixed hyperlipidemia Comments: Labs with next visit as CPE.   Need for immunization against influenza -     Flu Vaccine QUAD 36+ mos  IM    . Orders and follow up as documented in [place], reviewed diet, exercise and weight control, cardiovascular risk and specific lipid/LDL goals reviewed, reviewed medications and side effects in detail.  . Reviewed expectations re: course of current medical issues. . Outlined signs and symptoms indicating need for more acute intervention. . Patient verbalized understanding and all questions were answered. . Patient received an After Visit Summary.  CMA served as Education administrator during this visit. History, Physical, and Plan performed by medical provider. The above documentation has been reviewed and is accurate and complete. Briscoe Deutscher, D.O.  Briscoe Deutscher, DO [place] 06/12/2018

## 2018-06-12 ENCOUNTER — Encounter: Payer: Self-pay | Admitting: Family Medicine

## 2018-08-09 ENCOUNTER — Other Ambulatory Visit: Payer: Self-pay | Admitting: Family Medicine

## 2018-08-09 DIAGNOSIS — G249 Dystonia, unspecified: Secondary | ICD-10-CM

## 2018-09-11 NOTE — Progress Notes (Addendum)
   Virtual Visit via Video   I connected with David Silva by a video enabled telemedicine application and verified that I am speaking with the correct person using two identifiers. Location patient: Home Location provider: [facility], Office Persons participating in the virtual visit: David Silva, [place], DO   I discussed the limitations of evaluation and management by telemedicine and the availability of in person appointments. The patient expressed understanding and agreed to proceed.  Subjective:   HPI: follow up after starting baclofen. He if for the most part taking the Baclofen only. He has had to take the Valium a few times. He feels like the valium is more helpful during the day and baclofen at night and some during the day.    Reviewed all precautions and expectations with prevention of Covid-19. He is working one day a week and mandatory masks.   ROS: See pertinent positives and negatives per HPI.  Patient Active Problem List   Diagnosis Date Noted  . Mixed hyperlipidemia 08/27/2017  . Dystonia 01/21/2015    Social History   Tobacco Use  . Smoking status: Former Smoker    Last attempt to quit: 06/01/2013    Years since quitting: 5.2  . Smokeless tobacco: Never Used  . Tobacco comment: electronic cigs. daily  Substance Use Topics  . Alcohol use: Yes    Comment: 4 drinks per  yearly   Current Outpatient Medications:  .  baclofen (LIORESAL) 20 MG tablet, TAKE ONE TABLET BY MOUTH THREE TIMES A DAY AS NEEDED FOR MUSCLE SPASMS, Disp: 90 tablet, Rfl: 0 .  diazepam (VALIUM) 5 MG tablet, TAKE 1 TABLET (5 MG TOTAL) BY MOUTH EVERY 12 (TWELVE) HOURS AS NEEDED FOR ANXIETY., Disp: 60 tablet, Rfl: 0  Allergies  Allergen Reactions  . Amoxicillin Nausea And Vomiting  . Sulfate Rash    Objective:   VITALS: Per patient if applicable, see vitals. GENERAL: Alert, appears well and in no acute distress. HEENT: Atraumatic, conjunctiva clear, no obvious  abnormalities on inspection of external nose and ears. NECK: Normal movements of the head and neck. CARDIOPULMONARY: No increased WOB. Speaking in clear sentences. I:E ratio WNL.  MS: Moves all visible extremities without noticeable abnormality. PSYCH: Pleasant and cooperative, well-groomed. Speech normal rate and rhythm. Affect is appropriate. Insight and judgement are appropriate. Attention is focused, linear, and appropriate.  NEURO: CN grossly intact. Oriented as arrived to appointment on time with no prompting. Moves both UE equally.  SKIN: No obvious lesions, wounds, erythema, or cyanosis noted on face or hands.  Assessment and Plan:   David Silva was seen today for medication refill.  Diagnoses and all orders for this visit:  Dystonia -     baclofen (LIORESAL) 20 MG tablet; TAKE ONE TABLET BY MOUTH THREE TIMES A DAY AS NEEDED FOR MUSCLE SPASMS  Mixed hyperlipidemia Comments: Continue current treatment.    . Reviewed expectations re: course of current medical issues. . Discussed self-management of symptoms. . Outlined signs and symptoms indicating need for more acute intervention. . Patient verbalized understanding and all questions were answered. Marland Kitchen Health Maintenance issues including appropriate healthy diet, exercise, and smoking avoidance were discussed with patient. . See orders for this visit as documented in the electronic medical record.  Briscoe Deutscher, DO

## 2018-09-12 ENCOUNTER — Other Ambulatory Visit: Payer: Self-pay

## 2018-09-12 ENCOUNTER — Ambulatory Visit (INDEPENDENT_AMBULATORY_CARE_PROVIDER_SITE_OTHER): Payer: BLUE CROSS/BLUE SHIELD | Admitting: Family Medicine

## 2018-09-12 VITALS — Ht 71.0 in | Wt 233.0 lb

## 2018-09-12 DIAGNOSIS — E782 Mixed hyperlipidemia: Secondary | ICD-10-CM

## 2018-09-12 DIAGNOSIS — G249 Dystonia, unspecified: Secondary | ICD-10-CM | POA: Diagnosis not present

## 2018-09-13 ENCOUNTER — Encounter: Payer: Self-pay | Admitting: Family Medicine

## 2018-09-13 MED ORDER — BACLOFEN 20 MG PO TABS: ORAL_TABLET | ORAL | 3 refills | Status: DC

## 2018-10-17 ENCOUNTER — Encounter: Payer: Self-pay | Admitting: Family Medicine

## 2018-10-17 NOTE — Telephone Encounter (Signed)
Called pt and scheduled virtual visit with David Silva for tomorrow.

## 2018-10-18 ENCOUNTER — Ambulatory Visit (INDEPENDENT_AMBULATORY_CARE_PROVIDER_SITE_OTHER): Payer: BLUE CROSS/BLUE SHIELD | Admitting: Physician Assistant

## 2018-10-18 ENCOUNTER — Encounter: Payer: Self-pay | Admitting: Physician Assistant

## 2018-10-18 ENCOUNTER — Other Ambulatory Visit: Payer: BLUE CROSS/BLUE SHIELD

## 2018-10-18 ENCOUNTER — Ambulatory Visit: Payer: Self-pay | Admitting: *Deleted

## 2018-10-18 ENCOUNTER — Telehealth: Payer: Self-pay | Admitting: Physician Assistant

## 2018-10-18 DIAGNOSIS — M791 Myalgia, unspecified site: Secondary | ICD-10-CM | POA: Diagnosis not present

## 2018-10-18 DIAGNOSIS — R112 Nausea with vomiting, unspecified: Secondary | ICD-10-CM | POA: Diagnosis not present

## 2018-10-18 DIAGNOSIS — R509 Fever, unspecified: Secondary | ICD-10-CM

## 2018-10-18 DIAGNOSIS — Z20822 Contact with and (suspected) exposure to covid-19: Secondary | ICD-10-CM

## 2018-10-18 DIAGNOSIS — R51 Headache: Secondary | ICD-10-CM | POA: Diagnosis not present

## 2018-10-18 DIAGNOSIS — Z20828 Contact with and (suspected) exposure to other viral communicable diseases: Secondary | ICD-10-CM

## 2018-10-18 DIAGNOSIS — R6889 Other general symptoms and signs: Secondary | ICD-10-CM | POA: Diagnosis not present

## 2018-10-18 NOTE — Telephone Encounter (Signed)
Patient needs testing for COVID-19. I have placed order. Please contact patient and schedule ASAP.  [name] PA-C

## 2018-10-18 NOTE — Progress Notes (Signed)
Virtual Visit via Video   I connected with David Silva on 10/18/18 at  9:20 AM EDT by a video enabled telemedicine application and verified that I am speaking with the correct person using two identifiers. Location patient: Home Location provider: [facility], Office Persons participating in the virtual visit: David Silva, David Coke PA-C, David Pickler, LPN   I discussed the limitations of evaluation and management by telemedicine and the availability of in person appointments. The patient expressed understanding and agreed to proceed.  I acted as a Education administrator for [employer], PA-C [employer], LPN  Subjective:   HPI:   Fever Patient reports that he woke up on 10/17/18 with a "fever" of 99.8 degrees F. He has also been vomiting all day yesterday. Still having nausea. He states that he has been feeling really hot and lightheaded before vomiting. He also endorses slight headaches, body aches (does have muscle aches all the time due to hx of hystonia), slight sore throat and post nasal drip. Pt took benadryl and ibuprofen at bedtime last night.  He was able to eat yesterday afternoon and this morning. He is drinking plenty of fluids. Denies abdominal pain, neck stiffness, changes in vision.  He states that the friend he lives with had some "upset stomach" last week as well.  Denies any specific, known contacts with COVID.  ROS: See pertinent positives and negatives per HPI.  Patient Active Problem List   Diagnosis Date Noted  . Mixed hyperlipidemia 08/27/2017  . Dystonia 01/21/2015    Social History   Tobacco Use  . Smoking status: Former Smoker    Last attempt to quit: 06/01/2013    Years since quitting: 5.3  . Smokeless tobacco: Never Used  . Tobacco comment: electronic cigs. daily  Substance Use Topics  . Alcohol use: Yes    Comment: 4 drinks per  yearly    Current Outpatient Medications:  .  baclofen (LIORESAL) 20 MG tablet, TAKE ONE  TABLET BY MOUTH THREE TIMES A DAY AS NEEDED FOR MUSCLE SPASMS, Disp: 90 tablet, Rfl: 3 .  diazepam (VALIUM) 5 MG tablet, TAKE 1 TABLET (5 MG TOTAL) BY MOUTH EVERY 12 (TWELVE) HOURS AS NEEDED FOR ANXIETY., Disp: 60 tablet, Rfl: 0  Allergies  Allergen Reactions  . Amoxicillin Nausea And Vomiting  . Sulfate Rash    Objective:   VITALS: Per patient if applicable, see vitals. GENERAL: Alert, appears well and in no acute distress. HEENT: Atraumatic, conjunctiva clear, no obvious abnormalities on inspection of external nose and ears. NECK: Normal movements of the head and neck. CARDIOPULMONARY: No increased WOB. Speaking in clear sentences. I:E ratio WNL.  MS: Moves all visible extremities without noticeable abnormality. PSYCH: Pleasant and cooperative, well-groomed. Speech normal rate and rhythm. Affect is appropriate. Insight and judgement are appropriate. Attention is focused, linear, and appropriate.  NEURO: CN grossly intact. Oriented as arrived to appointment on time with no prompting. Moves both UE equally.  SKIN: No obvious lesions, wounds, erythema, or cyanosis noted on face or hands.  Assessment and Plan:   [name] was seen today for requesting covid-19 testing and fever.  Diagnoses and all orders for this visit:  Suspected Covid-19 Virus Infection   Will send for COVID-19 testing, required by his employer. I have sent a message to [place]. No red flags on exam. I encouraged him to continue to push fluids and eat small, bland meals. Denies any other needs at this time. Symptoms are improving with  time. Worsening precautions advised.  . Reviewed expectations re: course of current medical issues. . Discussed self-management of symptoms. . Outlined signs and symptoms indicating need for more acute intervention. . Patient verbalized understanding and all questions were answered. Marland Kitchen Health Maintenance issues including appropriate healthy diet, exercise, and smoking  avoidance were discussed with patient. . See orders for this visit as documented in the electronic medical record.  I discussed the assessment and treatment plan with the patient. The patient was provided an opportunity to ask questions and all were answered. The patient agreed with the plan and demonstrated an understanding of the instructions.   The patient was advised to call back or seek an in-person evaluation if the symptoms worsen or if the condition fails to improve as anticipated.   CMA or LPN served as scribe during this visit. History, Physical, and Plan performed by medical provider. The above documentation has been reviewed and is accurate and complete.  [place], [place] 10/18/2018

## 2018-10-18 NOTE — Telephone Encounter (Signed)
Pt notified of testing for today. See triage note.

## 2018-10-18 NOTE — Telephone Encounter (Addendum)
Called pt per request of his provider to schedule an appointment for testing for the covid 19.  His appointment is for today 10/18/2018 at 11:15 at the [facility] testing site. Advised to wear a mask, stay in the car with the windows rolled up until testing. Pt voiced understanding.   Order addend for testing.

## 2018-10-18 NOTE — Telephone Encounter (Signed)
Attempted to call patient. Left message that we will call him back.

## 2018-10-18 NOTE — Addendum Note (Signed)
Addended by: Curlene Labrum on: 10/18/2018 10:17 AM   Modules accepted: Orders

## 2018-10-18 NOTE — Addendum Note (Signed)
Addended by: Curlene Labrum on: 10/18/2018 10:48 AM   Modules accepted: Orders

## 2018-10-20 ENCOUNTER — Telehealth: Payer: Self-pay | Admitting: Family Medicine

## 2018-10-20 LAB — NOVEL CORONAVIRUS, NAA: SARS-CoV-2, NAA: NOT DETECTED

## 2018-10-20 NOTE — Telephone Encounter (Signed)
Do you know why pt is returning call?  Copied from [place] [id number]. Topic: General - Other >> Oct 20, 2018 10:11 AM Antonieta Iba C wrote: Reason for CRM: pt called in stating that he is returning a call from the office.   Please assist

## 2018-10-21 NOTE — Telephone Encounter (Signed)
Spoke to patient yesterday

## 2019-01-10 ENCOUNTER — Other Ambulatory Visit: Payer: Self-pay | Admitting: Family Medicine

## 2019-01-10 DIAGNOSIS — G249 Dystonia, unspecified: Secondary | ICD-10-CM

## 2019-01-12 NOTE — Telephone Encounter (Signed)
Last OV: 09/12/18 Next OV: 01/13/19 PMP website checked: last filled on 04/01/18

## 2019-01-13 ENCOUNTER — Encounter: Payer: Self-pay | Admitting: Family Medicine

## 2019-01-13 ENCOUNTER — Ambulatory Visit: Payer: BC Managed Care – PPO | Admitting: Family Medicine

## 2019-01-13 ENCOUNTER — Other Ambulatory Visit: Payer: Self-pay

## 2019-01-13 VITALS — BP 120/64 | HR 85 | Temp 98.1°F | Ht 73.0 in | Wt 229.0 lb

## 2019-01-13 DIAGNOSIS — E782 Mixed hyperlipidemia: Secondary | ICD-10-CM | POA: Diagnosis not present

## 2019-01-13 DIAGNOSIS — E669 Obesity, unspecified: Secondary | ICD-10-CM | POA: Diagnosis not present

## 2019-01-13 DIAGNOSIS — G249 Dystonia, unspecified: Secondary | ICD-10-CM | POA: Diagnosis not present

## 2019-01-13 MED ORDER — BACLOFEN 20 MG PO TABS: ORAL_TABLET | ORAL | 3 refills | Status: DC

## 2019-01-13 NOTE — Progress Notes (Signed)
David Silva is a 43 y.o. male is here for follow up.  History of Present Illness:   HPI: Doing well overall.  Muscle twitches that are fairly controlled with baclofen and Valium as needed.  He has been working in the yard a little more and notices that when he does this, her spasms worsening.  Due for labs today.  Review of Systems  Constitutional: Negative for chills, fever, malaise/fatigue and weight loss.  Respiratory: Negative for cough, shortness of breath and wheezing.   Cardiovascular: Negative for chest pain, palpitations and leg swelling.  Gastrointestinal: Negative for abdominal pain, constipation, diarrhea, nausea and vomiting.  Genitourinary: Negative for dysuria and urgency.  Musculoskeletal: Negative for joint pain and myalgias.  Skin: Negative for rash.  Neurological: Positive for tremors. Negative for dizziness and headaches.  Psychiatric/Behavioral: Negative for depression, substance abuse and suicidal ideas. The patient is not nervous/anxious.    Health Maintenance Due  Topic Date Due  . INFLUENZA VACCINE  12/31/2018   Depression screen [facility] 2/9 06/07/2018 04/19/2017 01/21/2015  Decreased Interest 0 0 0  Down, Depressed, Hopeless 0 0 0  PHQ - 2 Score 0 0 0  Altered sleeping 0 - -  Tired, decreased energy 1 - -  Change in appetite 1 - -  Feeling bad or failure about yourself  0 - -  Trouble concentrating 0 - -  Moving slowly or fidgety/restless 0 - -  Suicidal thoughts 0 - -  PHQ-9 Score 2 - -  Difficult doing work/chores Not difficult at all - -   PMHx, SurgHx, SocialHx, FamHx, Medications, and Allergies were reviewed in the Visit Navigator and updated as appropriate.   Patient Active Problem List   Diagnosis Date Noted  . Obesity (BMI 30.0-34.9) 01/15/2019  . Mixed hyperlipidemia 08/27/2017  . Dystonia 01/21/2015   Social History   Tobacco Use  . Smoking status: Former Smoker    Quit date: 06/01/2013    Years since quitting: 5.6  . Smokeless  tobacco: Never Used  . Tobacco comment: electronic cigs. daily  Substance Use Topics  . Alcohol use: Yes    Comment: 4 drinks per  yearly  . Drug use: No   Current Medications and Allergies   Current Outpatient Medications:  .  baclofen (LIORESAL) 20 MG tablet, TAKE ONE TABLET BY MOUTH THREE TIMES A DAY AS NEEDED FOR MUSCLE SPASMS, Disp: 90 tablet, Rfl: 3 .  diazepam (VALIUM) 5 MG tablet, TAKE 1 TABLET (5 MG TOTAL) BY MOUTH EVERY 12 (TWELVE) HOURS AS NEEDED FOR ANXIETY., Disp: 60 tablet, Rfl: 2   Allergies  Allergen Reactions  . Amoxicillin Nausea And Vomiting  . Sulfate Rash   Review of Systems   Pertinent items are noted in the HPI. Otherwise, a complete ROS is negative.  Vitals   Vitals:   01/13/19 1609  BP: 120/64  Pulse: 85  Temp: 98.1 F (36.7 C)  TempSrc: Oral  SpO2: 96%  Weight: 229 lb (103.9 kg)  Height: 6\' 1"  (1.854 m)     Body mass index is 30.21 kg/m.  Physical Exam   Physical Exam Vitals signs and nursing note reviewed.  Constitutional:      General: He is not in acute distress.    Appearance: He is well-developed.  HENT:     Head: Normocephalic and atraumatic.     Right Ear: External ear normal.     Left Ear: External ear normal.     Nose: Nose normal.  Eyes:  Conjunctiva/sclera: Conjunctivae normal.     Pupils: Pupils are equal, round, and reactive to light.  Neck:     Musculoskeletal: Normal range of motion and neck supple.  Cardiovascular:     Rate and Rhythm: Normal rate and regular rhythm.     Heart sounds: Normal heart sounds.  Pulmonary:     Effort: Pulmonary effort is normal.     Breath sounds: Normal breath sounds.  Abdominal:     General: Bowel sounds are normal.     Palpations: Abdomen is soft.  Musculoskeletal: Normal range of motion.  Skin:    General: Skin is warm and dry.  Neurological:     Mental Status: He is alert and oriented to person, place, and time.  Psychiatric:        Behavior: Behavior normal.         Thought Content: Thought content normal.        Judgment: Judgment normal.    Assessment and Plan   David Silva was seen today for follow-up.  Diagnoses and all orders for this visit:  Dystonia Comments: Worsening slightly.  3 times daily. I did reassure the patient that he may take baclofen 3 times daily. Orders: -     baclofen (LIORESAL) 20 MG tablet; TAKE ONE TABLET BY MOUTH THREE TIMES A DAY AS NEEDED FOR MUSCLE SPASMS -     CBC with Differential/Platelet; Future -     CBC with Differential/Platelet  Mixed hyperlipidemia -     Lipid panel; Future -     Comprehensive metabolic panel; Future  Obesity (BMI 30.0-34.9) Comments: We discussed healthy food choices and dedicated exercise.   . Orders and follow up as documented in [place], reviewed diet, exercise and weight control, cardiovascular risk and specific lipid/LDL goals reviewed, reviewed medications and side effects in detail.  . Reviewed expectations re: course of current medical issues. . Outlined signs and symptoms indicating need for more acute intervention. . Patient verbalized understanding and all questions were answered. . Patient received an After Visit Summary.  David Deutscher, DO [place], [place] [facility] 01/15/2019

## 2019-01-14 LAB — CBC WITH DIFFERENTIAL/PLATELET
Absolute Monocytes: 502 cells/uL (ref 200–950)
Basophils Absolute: 77 cells/uL (ref 0–200)
Basophils Relative: 0.9 %
Eosinophils Absolute: 68 cells/uL (ref 15–500)
Eosinophils Relative: 0.8 %
HCT: 46.5 % (ref 38.5–50.0)
Hemoglobin: 15.9 g/dL (ref 13.2–17.1)
Lymphs Abs: 2584 cells/uL (ref 850–3900)
MCH: 30.3 pg (ref 27.0–33.0)
MCHC: 34.2 g/dL (ref 32.0–36.0)
MCV: 88.7 fL (ref 80.0–100.0)
MPV: 10.6 fL (ref 7.5–12.5)
Monocytes Relative: 5.9 %
Neutro Abs: 5270 cells/uL (ref 1500–7800)
Neutrophils Relative %: 62 %
Platelets: 280 10*3/uL (ref 140–400)
RBC: 5.24 10*6/uL (ref 4.20–5.80)
RDW: 12.6 % (ref 11.0–15.0)
Total Lymphocyte: 30.4 %
WBC: 8.5 10*3/uL (ref 3.8–10.8)

## 2019-01-14 LAB — COMPREHENSIVE METABOLIC PANEL
AG Ratio: 2.1 (calc) (ref 1.0–2.5)
ALT: 29 U/L (ref 9–46)
AST: 24 U/L (ref 10–40)
Albumin: 4.8 g/dL (ref 3.6–5.1)
Alkaline phosphatase (APISO): 68 U/L (ref 36–130)
BUN: 14 mg/dL (ref 7–25)
CO2: 23 mmol/L (ref 20–32)
Calcium: 9.9 mg/dL (ref 8.6–10.3)
Chloride: 107 mmol/L (ref 98–110)
Creat: 0.97 mg/dL (ref 0.60–1.35)
Globulin: 2.3 g/dL (calc) (ref 1.9–3.7)
Glucose, Bld: 95 mg/dL (ref 65–99)
Potassium: 3.9 mmol/L (ref 3.5–5.3)
Sodium: 140 mmol/L (ref 135–146)
Total Bilirubin: 0.6 mg/dL (ref 0.2–1.2)
Total Protein: 7.1 g/dL (ref 6.1–8.1)

## 2019-01-14 LAB — LIPID PANEL
Cholesterol: 189 mg/dL (ref <200)
HDL: 38 mg/dL — ABNORMAL LOW (ref >=40)
LDL Cholesterol (Calc): 126 mg/dL (calc) — ABNORMAL HIGH
Non-HDL Cholesterol (Calc): 151 mg/dL (calc) — ABNORMAL HIGH (ref <130)
Total CHOL/HDL Ratio: 5 (calc) — ABNORMAL HIGH (ref <5.0)
Triglycerides: 142 mg/dL (ref <150)

## 2019-01-15 ENCOUNTER — Encounter: Payer: Self-pay | Admitting: Family Medicine

## 2019-01-15 DIAGNOSIS — E669 Obesity, unspecified: Secondary | ICD-10-CM | POA: Insufficient documentation

## 2019-03-20 ENCOUNTER — Telehealth: Payer: Self-pay | Admitting: Family Medicine

## 2019-03-20 NOTE — Telephone Encounter (Signed)
Patient would like to be seen by Dr. Juleen China for a mole he would like looked at. Please advise if Dr. Juleen China can see pt before her last day. Thank you!

## 2019-03-20 NOTE — Telephone Encounter (Signed)
She does not have any 40 min app can you call and see if he would like to be set up for another provider?

## 2019-03-20 NOTE — Telephone Encounter (Signed)
Patient had asked if Dr. Juleen China could work him in for an appt before she leaves. Dr. Juleen China is not able to see him before her last day, but I called patient to schedule an appt with another provider here, whoever he chooses to [employer] to. No answer, LVM.

## 2019-03-22 ENCOUNTER — Other Ambulatory Visit: Payer: Self-pay

## 2019-03-22 DIAGNOSIS — Z800 Family history of malignant neoplasm of digestive organs: Secondary | ICD-10-CM

## 2019-09-20 ENCOUNTER — Ambulatory Visit: Payer: BC Managed Care – PPO | Admitting: Physician Assistant

## 2019-10-02 ENCOUNTER — Ambulatory Visit (INDEPENDENT_AMBULATORY_CARE_PROVIDER_SITE_OTHER): Payer: BC Managed Care – PPO | Admitting: Physician Assistant

## 2019-10-02 ENCOUNTER — Encounter: Payer: Self-pay | Admitting: Physician Assistant

## 2019-10-02 ENCOUNTER — Other Ambulatory Visit: Payer: Self-pay

## 2019-10-02 ENCOUNTER — Other Ambulatory Visit: Payer: Self-pay | Admitting: Physician Assistant

## 2019-10-02 VITALS — BP 132/80 | HR 66 | Temp 98.1°F | Ht 73.0 in | Wt 234.1 lb

## 2019-10-02 DIAGNOSIS — Z803 Family history of malignant neoplasm of breast: Secondary | ICD-10-CM

## 2019-10-02 DIAGNOSIS — Z800 Family history of malignant neoplasm of digestive organs: Secondary | ICD-10-CM

## 2019-10-02 DIAGNOSIS — L820 Inflamed seborrheic keratosis: Secondary | ICD-10-CM | POA: Diagnosis not present

## 2019-10-02 DIAGNOSIS — G249 Dystonia, unspecified: Secondary | ICD-10-CM

## 2019-10-02 DIAGNOSIS — Z13220 Encounter for screening for lipoid disorders: Secondary | ICD-10-CM

## 2019-10-02 DIAGNOSIS — Z0001 Encounter for general adult medical examination with abnormal findings: Secondary | ICD-10-CM | POA: Diagnosis not present

## 2019-10-02 DIAGNOSIS — E669 Obesity, unspecified: Secondary | ICD-10-CM

## 2019-10-02 DIAGNOSIS — E66811 Obesity, class 1: Secondary | ICD-10-CM

## 2019-10-02 DIAGNOSIS — Z136 Encounter for screening for cardiovascular disorders: Secondary | ICD-10-CM | POA: Diagnosis not present

## 2019-10-02 DIAGNOSIS — D229 Melanocytic nevi, unspecified: Secondary | ICD-10-CM

## 2019-10-02 LAB — CBC WITH DIFFERENTIAL/PLATELET
Basophils Absolute: 0.1 10*3/uL (ref 0.0–0.1)
Basophils Relative: 0.9 % (ref 0.0–3.0)
Eosinophils Absolute: 0.1 10*3/uL (ref 0.0–0.7)
Eosinophils Relative: 1.1 % (ref 0.0–5.0)
HCT: 45.9 % (ref 39.0–52.0)
Hemoglobin: 15.5 g/dL (ref 13.0–17.0)
Lymphocytes Relative: 25.7 % (ref 12.0–46.0)
Lymphs Abs: 2.3 10*3/uL (ref 0.7–4.0)
MCHC: 33.9 g/dL (ref 30.0–36.0)
MCV: 90.7 fl (ref 78.0–100.0)
Monocytes Absolute: 0.7 10*3/uL (ref 0.1–1.0)
Monocytes Relative: 8.4 % (ref 3.0–12.0)
Neutro Abs: 5.7 10*3/uL (ref 1.4–7.7)
Neutrophils Relative %: 63.9 % (ref 43.0–77.0)
Platelets: 291 10*3/uL (ref 150.0–400.0)
RBC: 5.05 Mil/uL (ref 4.22–5.81)
RDW: 13.4 % (ref 11.5–15.5)
WBC: 8.8 10*3/uL (ref 4.0–10.5)

## 2019-10-02 LAB — COMPREHENSIVE METABOLIC PANEL
ALT: 72 U/L — ABNORMAL HIGH (ref 0–53)
AST: 45 U/L — ABNORMAL HIGH (ref 0–37)
Albumin: 4.5 g/dL (ref 3.5–5.2)
Alkaline Phosphatase: 84 U/L (ref 39–117)
BUN: 13 mg/dL (ref 6–23)
CO2: 26 mEq/L (ref 19–32)
Calcium: 9.4 mg/dL (ref 8.4–10.5)
Chloride: 106 mEq/L (ref 96–112)
Creatinine, Ser: 1.04 mg/dL (ref 0.40–1.50)
GFR: 78.03 mL/min (ref >60.00)
Glucose, Bld: 89 mg/dL (ref 70–99)
Potassium: 4.3 mEq/L (ref 3.5–5.1)
Sodium: 138 mEq/L (ref 135–145)
Total Bilirubin: 0.5 mg/dL (ref 0.2–1.2)
Total Protein: 6.7 g/dL (ref 6.0–8.3)

## 2019-10-02 LAB — LIPID PANEL
Cholesterol: 194 mg/dL (ref 0–200)
HDL: 40.7 mg/dL (ref >39.00)
LDL Cholesterol: 118 mg/dL — ABNORMAL HIGH (ref 0–99)
NonHDL: 153.7
Total CHOL/HDL Ratio: 5
Triglycerides: 181 mg/dL — ABNORMAL HIGH (ref 0.0–149.0)
VLDL: 36.2 mg/dL (ref 0.0–40.0)

## 2019-10-02 MED ORDER — BACLOFEN 20 MG PO TABS: ORAL_TABLET | ORAL | 3 refills | Status: DC

## 2019-10-02 NOTE — Patient Instructions (Signed)

## 2019-10-02 NOTE — Progress Notes (Signed)
Subjective:    David Silva is a 44 y.o. male and is here for a comprehensive physical exam.  HPI  Health Maintenance Due  Topic Date Due  . COVID-19 Vaccine (1) Never done    Acute Concerns: Atypical mole -- has a raised mole in the middle of his back that his wife has recently commented on that has possibly changed shape. Genetics counseling -- has a family history of multiple cancers (lung, colon, possibly breast) and would like to pursue genetic testing  Chronic Issues: Dystonia -- baclofen is typically helpful. Was diagnosed by [facility] neurology. Spasms mostly in the R arm, neck , and lower legs. Currently taking baclofen BID. Cold weather is a trigger. Valium used for breakthrough symptoms -- sporadic and rare use.  Health Maintenance: Immunizations -- needs COVID-19 vaccine Colonoscopy -- never had a colonoscopy  PSA -- n/a Diet -- eats all food groups -- likes fruits/veggies Caffeine intake -- a few 20 oz sodas daily (Coke Zero) Sleep habits -- overall good Exercise -- limited 2/2 dystonia; tries to walk for exercise when he can Weight -- Weight: 234 lb 1.6 oz (106.2 kg)  Weight history Wt Readings from Last 10 Encounters:  10/02/19 234 lb 1.6 oz (106.2 kg)  01/13/19 229 lb (103.9 kg)  09/12/18 233 lb (105.7 kg)  06/07/18 233 lb (105.7 kg)  12/31/17 230 lb 3.2 oz (104.4 kg)  08/27/17 226 lb (102.5 kg)  05/28/17 226 lb (102.5 kg)  05/12/17 219 lb 9.6 oz (99.6 kg)  04/30/17 215 lb 3.2 oz (97.6 kg)  04/27/17 218 lb (98.9 kg)   Mood -- no depression/anxiety Tobacco use -- none Alcohol use --- very little   Depression screen [facility] 2/9 10/02/2019  Decreased Interest 0  Down, Depressed, Hopeless 0  PHQ - 2 Score 0  Altered sleeping -  Tired, decreased energy -  Change in appetite -  Feeling bad or failure about yourself  -  Trouble concentrating -  Moving slowly or fidgety/restless -  Suicidal thoughts -  PHQ-9 Score -  Difficult doing  work/chores -     Other providers/specialists: Patient Care Team: [name], [place] as PCP - General (Physician Assistant) Sonia Baller, MD as Referring Physician (Neurology)   PMHx, SurgHx, SocialHx, Medications, and Allergies were reviewed in the Visit Navigator and updated as appropriate.   Past Medical History:  Diagnosis Date  . Allergy      Past Surgical History:  Procedure Laterality Date  . Dystonia  2017     Family History  Problem Relation Age of Onset  . Lung cancer Father   . Diabetes Maternal Grandmother   . Heart disease Maternal Grandmother   . Colon cancer Maternal Grandfather   . Heart failure Paternal Grandmother   . Heart disease Paternal Grandfather   . Cancer Paternal Aunt   . Colon cancer Paternal Uncle     Social History   Tobacco Use  . Smoking status: Former Smoker    Quit date: 06/01/2013    Years since quitting: 6.3  . Smokeless tobacco: Never Used  . Tobacco comment: electronic cigs. daily  Substance Use Topics  . Alcohol use: Yes    Comment: 4 drinks per  yearly  . Drug use: No    Review of Systems:   Review of Systems  Constitutional: Negative for chills, fever, malaise/fatigue and weight loss.  HENT: Negative for hearing loss, sinus pain and sore throat.   Respiratory: Negative for cough  and hemoptysis.   Cardiovascular: Negative for chest pain, palpitations, leg swelling and PND.  Gastrointestinal: Negative for abdominal pain, constipation, diarrhea, heartburn, nausea and vomiting.  Genitourinary: Negative for dysuria, frequency and urgency.  Musculoskeletal: Negative for back pain, myalgias and neck pain.  Skin: Negative for itching and rash.  Neurological: Negative for dizziness, tingling, seizures and headaches.  Endo/Heme/Allergies: Negative for polydipsia.  Psychiatric/Behavioral: Negative for depression. The patient is not nervous/anxious.     Objective:   Vitals:   10/02/19 1321  BP: 132/80  Pulse:  66  Temp: 98.1 F (36.7 C)  SpO2: 97%   Body mass index is 30.89 kg/m.  General Appearance:  Alert, cooperative, no distress, appears stated age  Head:  Normocephalic, without obvious abnormality, atraumatic  Eyes:  PERRL, conjunctiva/corneas clear, EOM's intact, fundi benign, both eyes       Ears:  Normal TM's and external ear canals, both ears  Nose: Nares normal, septum midline, mucosa normal, no drainage    or sinus tenderness  Throat: Lips, mucosa, and tongue normal; teeth and gums normal  Neck: Supple, symmetrical, trachea midline, no adenopathy; thyroid:  No enlargement/tenderness/nodules; no carotit bruit or JVD  Back:   Symmetric, no curvature, ROM normal, no CVA tenderness  Lungs:   Clear to auscultation bilaterally, respirations unlabored  Chest wall:  No tenderness or deformity  Heart:  Regular rate and rhythm, S1 and S2 normal, no murmur, rub   or gallop  Abdomen:   Soft, non-tender, bowel sounds active all four quadrants, no masses, no organomegaly  Extremities: Extremities normal, atraumatic, no cyanosis or edema  Prostate: Not done.   Skin: Skin color, texture, turgor normal, no rashes Raised flesh-colored mole with irregular borders in center of thoracic spine area  Lymph nodes: Cervical, supraclavicular, and axillary nodes normal  Neurologic: CNII-XII grossly intact. Normal strength, sensation and reflexes throughout   Procedure: mole removal Informed consent:  Discussed risks (permanent scarring, infection, pain, bleeding, bruising, redness, and recurrence of the lesion) and benefits of the procedure, as well as the alternatives.  He is aware that skin tags are benign lesions, and their removal is often not considered medically necessary.  Informed consent was obtained. Anesthesia: 1 cc of lidocaine with eip  The area was prepared and draped in a standard fashion. Shave biopsy performed on <0.5 cm lesion to back Antibiotic ointment and a sterile dressing were  applied.   The patient tolerated procedure well. Number of lesions removed:  1  Assessment/Plan:   [place] was seen today for transitions of care and annual exam.  Diagnoses and all orders for this visit:  Encounter for general adult medical examination with abnormal findings Today patient counseled on age appropriate routine health concerns for screening and prevention, each reviewed and up to date or declined. Immunizations reviewed and up to date or declined. Labs ordered and reviewed. Risk factors for depression reviewed and negative. Hearing function and visual acuity are intact. ADLs screened and addressed as needed. Functional ability and level of safety reviewed and appropriate. Education, counseling and referrals performed based on assessed risks today. Patient provided with a copy of personalized plan for preventive services.  Encounter for lipid screening for cardiovascular disease -     Lipid panel  Dystonia Well controlled per patient, refill of baclofen today. He denies need for valium refill at this time. -     Comprehensive metabolic panel -     CBC with Differential/Platelet  [place] Shave biopsy performed and specimen to  be sent off. -     Surgical pathology( [place]/ [facility])  Family history of colon cancer; Family history of breast cancer Referral to genetics per patient request. -     Ambulatory referral to Genetics  Obesity (BMI 30.0-34.9) Continue to work on diet and exercise as able.  Well Adult Exam: Labs ordered: Yes. Patient counseling was done. See below for items discussed. Discussed the patient's BMI.  The BMI is not in the acceptable range; BMI management plan is completed Follow up in one year.  Patient Counseling: [x]   Nutrition: Stressed importance of moderation in sodium/caffeine intake, saturated fat and cholesterol, caloric balance, sufficient intake of fresh fruits, vegetables, and fiber.  [x]   Stressed the importance of regular exercise.     []   Substance Abuse: Discussed cessation/primary prevention of tobacco, alcohol, or other drug use; driving or other dangerous activities under the influence; availability of treatment for abuse.   [x]   Injury prevention: Discussed safety belts, safety helmets, smoke detector, smoking near bedding or upholstery.   []   Sexuality: Discussed sexually transmitted diseases, partner selection, use of condoms, avoidance of unintended pregnancy  and contraceptive alternatives.   [x]   Dental health: Discussed importance of regular tooth brushing, flossing, and dental visits.  [x]   Health maintenance and immunizations reviewed. Please refer to Health maintenance section.    CMA or LPN served as scribe during this visit. History, Physical, and Plan performed by medical provider. The above documentation has been reviewed and is accurate and complete.  [name], PA-C [place]

## 2019-10-03 ENCOUNTER — Other Ambulatory Visit: Payer: Self-pay | Admitting: Physician Assistant

## 2019-10-03 DIAGNOSIS — R7989 Other specified abnormal findings of blood chemistry: Secondary | ICD-10-CM

## 2019-10-05 ENCOUNTER — Ambulatory Visit: Payer: BC Managed Care – PPO | Attending: Internal Medicine

## 2019-10-05 DIAGNOSIS — Z23 Encounter for immunization: Secondary | ICD-10-CM

## 2019-10-05 NOTE — Progress Notes (Signed)
   Covid-19 Vaccination Clinic  Name:  David Silva    MRN: [id number] DOB: 1975/09/11  10/05/2019  [name] was observed post Covid-19 immunization for 15 minutes without incident. He was provided with Vaccine Information Sheet and instruction to access the V-Safe system.   [name] was instructed to call 911 with any severe reactions post vaccine: Marland Kitchen Difficulty breathing  . Swelling of face and throat  . A fast heartbeat  . A bad rash all over body  . Dizziness and weakness   Immunizations Administered    Name Date Dose VIS Date Route   Pfizer COVID-19 Vaccine 10/05/2019  1:19 PM 0.3 mL 07/26/2018 Intramuscular   Manufacturer: [employer]   Lot: [id number]   [place]: [id number]

## 2019-10-31 ENCOUNTER — Ambulatory Visit: Payer: BC Managed Care – PPO

## 2019-11-02 ENCOUNTER — Ambulatory Visit: Payer: BC Managed Care – PPO | Attending: Internal Medicine

## 2019-11-02 DIAGNOSIS — Z23 Encounter for immunization: Secondary | ICD-10-CM

## 2019-11-02 NOTE — Progress Notes (Signed)
   Covid-19 Vaccination Clinic  Name:  David Silva    MRN: [id number] DOB: 02/08/1976  11/02/2019  David Silva was observed post Covid-19 immunization for 15 minutes without incident. He was provided with Vaccine Information Sheet and instruction to access the V-Safe system.   David Silva was instructed to call 911 with any severe reactions post vaccine: Marland Kitchen Difficulty breathing  . Swelling of face and throat  . A fast heartbeat  . A bad rash all over body  . Dizziness and weakness   Immunizations Administered    Name Date Dose VIS Date Route   Pfizer COVID-19 Vaccine 11/02/2019  9:51 AM 0.3 mL 07/26/2018 Intramuscular   Manufacturer: [employer]   Lot: [id number]   [place]: [id number]

## 2020-04-06 ENCOUNTER — Other Ambulatory Visit: Payer: Self-pay | Admitting: Physician Assistant

## 2020-04-06 DIAGNOSIS — G249 Dystonia, unspecified: Secondary | ICD-10-CM

## 2020-04-29 ENCOUNTER — Other Ambulatory Visit: Payer: Self-pay | Admitting: Family Medicine

## 2020-04-29 DIAGNOSIS — G249 Dystonia, unspecified: Secondary | ICD-10-CM

## 2020-10-20 ENCOUNTER — Other Ambulatory Visit: Payer: Self-pay | Admitting: Physician Assistant

## 2020-10-20 DIAGNOSIS — G249 Dystonia, unspecified: Secondary | ICD-10-CM

## 2020-12-19 ENCOUNTER — Telehealth: Payer: Self-pay

## 2020-12-19 DIAGNOSIS — G249 Dystonia, unspecified: Secondary | ICD-10-CM

## 2020-12-19 NOTE — Telephone Encounter (Signed)
Patient's wife called again that the prescription was denied to be filled. I told her that David Silva has not been seen in a year. She stated that he would only see David Silva and she wants to speak to a nurse. Can someone call her back at [phone]

## 2020-12-19 NOTE — Telephone Encounter (Signed)
Spoke to pt told him I need him to schedule an appt and then I will send in 30 day supply of medication. Told him Aldona Bar comes back August 8th. Pt verbalized understanding and will call back and schedule an appt. Told him once I see appt scheduled will send medication. Pt verbalized understanding.

## 2020-12-19 NOTE — Telephone Encounter (Signed)
MEDICATION: baclofen (LIORESAL) 20 MG tablet  PHARMACY:  [facility] [id number] - [place], [place] Phone:  [phone]  Fax:  [phone]      Comments:   **Let patient know to contact pharmacy at the end of the day to make sure medication is ready. **  ** Please notify patient to allow 48-72 hours to process**  **Encourage patient to contact the pharmacy for refills or they can request refills through [facility]**

## 2020-12-23 ENCOUNTER — Other Ambulatory Visit: Payer: Self-pay | Admitting: Physician Assistant

## 2020-12-23 DIAGNOSIS — G249 Dystonia, unspecified: Secondary | ICD-10-CM

## 2020-12-23 MED ORDER — BACLOFEN 20 MG PO TABS: ORAL_TABLET | ORAL | 0 refills | Status: DC

## 2020-12-23 NOTE — Addendum Note (Signed)
Addended by: Marian Sorrow on: 12/23/2020 01:20 PM   Modules accepted: Orders

## 2020-12-23 NOTE — Telephone Encounter (Addendum)
Patient is scheduled for an appointment on 8/8 with Samantha. Orr would like his prescription, baclofen (LIORESAL) 20 MG tablet to be sent in. Patient is completely out.

## 2020-12-23 NOTE — Telephone Encounter (Signed)
Spoke to pt verified pharmacy. Told him will send Rx in now. Pt verbalized understanding.

## 2020-12-24 ENCOUNTER — Ambulatory Visit: Payer: BC Managed Care – PPO | Admitting: Physician Assistant

## 2020-12-25 DIAGNOSIS — Z20822 Contact with and (suspected) exposure to covid-19: Secondary | ICD-10-CM | POA: Diagnosis not present

## 2020-12-25 DIAGNOSIS — U071 COVID-19: Secondary | ICD-10-CM

## 2020-12-25 HISTORY — DX: COVID-19: U07.1

## 2021-01-03 NOTE — Progress Notes (Signed)
David Silva is a 45 y.o. male is here for medication follow up.  I acted as a Education administrator for [employer], PA-C Anselmo Pickler, LPN   History of Present Illness:   Chief Complaint  Patient presents with   Spasms   Lyme Disease     HPI  Spasms/Dystonia Originally diagnosed by [facility]. Was seeing Dr. Hall Busing as recently as 2017. I have reviewed this note from 04/16/16. He was put on trihexyphenidyl but he was concerned about long term side effects so he did not continue to take this. He is currently taking baclofen 20 mg TID and tolerating but does not feel like it is controlling symptoms as much as usually does. He also takes valium 5 mg prn to help prevent severe worsening symptoms that would typically cause ER visit.  Tick bites He stated he has been bitten twice in the past 2 months by ticks. These ticks were not engorged based. Suspects that they were on less than 24 hours. He denies: fatigue, nausea, neck stiffness, unusual joint pain or movement (compared to his baseline.)  Eye concern Wife has noticed a small darkened area on the inside of his R eye. Has been slightly itchy. Denies: pain, change in vision, tearing or discharge.    Health Maintenance Due  Topic Date Due   Hepatitis C Screening  Never done   INFLUENZA VACCINE  12/30/2020    Past Medical History:  Diagnosis Date   Allergy    COVID-19 12/25/2020     Social History   Tobacco Use   Smoking status: Former    Types: Cigarettes    Quit date: 06/01/2013    Years since quitting: 7.6   Smokeless tobacco: Never   Tobacco comments:    electronic cigs. daily  Vaping Use   Vaping Use: Every day  Substance Use Topics   Alcohol use: Yes    Comment: 4 drinks per  yearly   Drug use: No    Past Surgical History:  Procedure Laterality Date   Dystonia  2017    Family History  Problem Relation Age of Onset   Lung cancer Father    Diabetes Maternal Grandmother    Heart disease Maternal  Grandmother    Colon cancer Maternal Grandfather    Heart failure Paternal Grandmother    Heart disease Paternal Grandfather    Cancer Paternal Aunt    Colon cancer Paternal Uncle     PMHx, SurgHx, SocialHx, FamHx, Medications, and Allergies were reviewed in the Visit Navigator and updated as appropriate.   Patient Active Problem List   Diagnosis Date Noted   Obesity (BMI 30.0-34.9) 01/15/2019   Mixed hyperlipidemia 08/27/2017   Dystonia 01/21/2015    Social History   Tobacco Use   Smoking status: Former    Types: Cigarettes    Quit date: 06/01/2013    Years since quitting: 7.6   Smokeless tobacco: Never   Tobacco comments:    electronic cigs. daily  Vaping Use   Vaping Use: Every day  Substance Use Topics   Alcohol use: Yes    Comment: 4 drinks per  yearly   Drug use: No    Current Medications and Allergies:    Current Outpatient Medications:    diazepam (VALIUM) 5 MG tablet, TAKE 1 TABLET (5 MG TOTAL) BY MOUTH EVERY 12 (TWELVE) HOURS AS NEEDED FOR ANXIETY., Disp: 60 tablet, Rfl: 2   baclofen (LIORESAL) 20 MG tablet, TAKE ONE TABLET BY MOUTH THREE TIMES A  DAY AS NEEDED MUSCLE SPASMS, Disp: 120 tablet, Rfl: 3   Allergies  Allergen Reactions   Amoxicillin Nausea And Vomiting   Sulfate Rash    Review of Systems   ROS Negative unless otherwise specified per HPI.  Vitals:   Vitals:   01/06/21 0826  BP: 108/78  Pulse: 77  Temp: 98.2 F (36.8 C)  TempSrc: Temporal  SpO2: 97%  Weight: 232 lb (105.2 kg)  Height: '6\' 1"'$  (1.854 m)     Body mass index is 30.61 kg/m.   Physical Exam:    Physical Exam Vitals and nursing note reviewed.  Constitutional:      General: He is not in acute distress.    Appearance: He is well-developed. He is not ill-appearing or toxic-appearing.  Eyes:     General: Lids are normal.        Right eye: No foreign body or discharge.        Left eye: No foreign body or discharge.     Extraocular Movements:     Right eye: Normal  extraocular motion.     Left eye: Normal extraocular motion.  Cardiovascular:     Rate and Rhythm: Normal rate and regular rhythm.     Pulses: Normal pulses.     Heart sounds: Normal heart sounds, S1 normal and S2 normal.     Comments: No LE edema Pulmonary:     Effort: Pulmonary effort is normal.     Breath sounds: Normal breath sounds.  Skin:    General: Skin is warm and dry.  Neurological:     Mental Status: He is alert.     GCS: GCS eye subscore is 4. GCS verbal subscore is 5. GCS motor subscore is 6.  Psychiatric:        Speech: Speech normal.        Behavior: Behavior normal. Behavior is cooperative.     Assessment and Plan:    David Silva was seen today for spasms and lyme disease.  Diagnoses and all orders for this visit:  Dystonia Refilled baclofen medications today per patient request. Valium refill will be sent as well (currently my e-prescribe is down) I have also put in referral for neurology per patient request. Denies any new red flag symptoms or concerns Orders: -     baclofen (LIORESAL) 20 MG tablet; TAKE ONE TABLET BY MOUTH THREE TIMES A DAY AS NEEDED MUSCLE SPASMS  Tick bite, unspecified site, initial encounter No red flags on discussion. After shared decision making, we decided to not test for lyme at this time due to lack of specific symptoms.  If any new or worsening symptoms, recommend follow-up with [place] immediately.  Eye abnormality No red flags. Recommend evaluation by eye doctor when he returns for routine eye exam.  Time spent with patient today was 25 minutes which consisted of chart review, discussing diagnosis, work up, treatment answering questions and documentation.   CMA or LPN served as scribe during this visit. History, Physical, and Plan performed by medical provider. The above documentation has been reviewed and is accurate and complete.  [name], PA-C [place], [place] 01/06/2021  Follow-up: No follow-ups on  file.

## 2021-01-06 ENCOUNTER — Encounter: Payer: Self-pay | Admitting: Physician Assistant

## 2021-01-06 ENCOUNTER — Other Ambulatory Visit: Payer: Self-pay | Admitting: *Deleted

## 2021-01-06 ENCOUNTER — Ambulatory Visit: Payer: BC Managed Care – PPO | Admitting: Physician Assistant

## 2021-01-06 ENCOUNTER — Other Ambulatory Visit: Payer: Self-pay

## 2021-01-06 VITALS — BP 108/78 | HR 77 | Temp 98.2°F | Ht 73.0 in | Wt 232.0 lb

## 2021-01-06 DIAGNOSIS — Q159 Congenital malformation of eye, unspecified: Secondary | ICD-10-CM

## 2021-01-06 DIAGNOSIS — G249 Dystonia, unspecified: Secondary | ICD-10-CM | POA: Diagnosis not present

## 2021-01-06 DIAGNOSIS — Z87828 Personal history of other (healed) physical injury and trauma: Secondary | ICD-10-CM | POA: Diagnosis not present

## 2021-01-06 DIAGNOSIS — W57XXXA Bitten or stung by nonvenomous insect and other nonvenomous arthropods, initial encounter: Secondary | ICD-10-CM

## 2021-01-06 MED ORDER — DIAZEPAM 5 MG PO TABS: 5.0000 mg | ORAL_TABLET | Freq: Two times a day (BID) | ORAL | 0 refills | Status: DC | PRN

## 2021-01-06 MED ORDER — BACLOFEN 20 MG PO TABS: ORAL_TABLET | ORAL | 3 refills | Status: DC

## 2021-01-06 NOTE — Patient Instructions (Addendum)
It was great to see you!  We have referred you to neurology, I'm going to send you to Dr. Carles Collet.  I have resent in the baclofen.   I will send in valium as soon as I can.  Please go to the eye doctor to further evaluate your eye.  If a referral was placed today, you will be contacted for an appointment. Please note that routine referrals can sometimes take up to 3-4 weeks to process. Please call our office if you haven't heard anything after this time frame.  Take care,  [name] PA-C

## 2021-01-06 NOTE — Telephone Encounter (Signed)
Dr. Jerline Pain, please refill for pt.

## 2021-01-14 ENCOUNTER — Ambulatory Visit: Payer: BC Managed Care – PPO | Admitting: Physician Assistant

## 2021-07-17 ENCOUNTER — Encounter: Payer: Self-pay | Admitting: Physician Assistant

## 2021-07-18 ENCOUNTER — Other Ambulatory Visit: Payer: Self-pay | Admitting: Physician Assistant

## 2021-07-18 DIAGNOSIS — G249 Dystonia, unspecified: Secondary | ICD-10-CM

## 2021-07-18 MED ORDER — DIAZEPAM 5 MG PO TABS: 5.0000 mg | ORAL_TABLET | Freq: Two times a day (BID) | ORAL | 0 refills | Status: DC | PRN

## 2021-07-22 ENCOUNTER — Ambulatory Visit: Payer: BC Managed Care – PPO | Admitting: Family Medicine

## 2021-10-08 ENCOUNTER — Other Ambulatory Visit: Payer: Self-pay | Admitting: Physician Assistant

## 2021-10-08 DIAGNOSIS — G249 Dystonia, unspecified: Secondary | ICD-10-CM

## 2021-10-16 NOTE — Progress Notes (Signed)
David Silva Phone: [phone] Subjective:   IVilma Silva, am serving as a scribe for Dr. Hulan Saas. This visit occurred during the SARS-CoV-2 public health emergency.  Safety protocols were in place, including screening questions prior to the visit, additional usage of staff PPE, and extensive cleaning of exam room while observing appropriate contact time as indicated for disinfecting solutions.   I'm seeing this patient by the request  of:  David Silva, [place]  CC: Bilateral knee pain  [id number]  David Silva is a 46 y.o. male coming in with complaint of bilateral knee pain and numbness. Both knees will lock up and then hurts for days. Instability as well. Has been happening a couple of years. Recently becoming more frequent. Valium every now and again.       Past Medical History:  Diagnosis Date   Allergy    COVID-19 12/25/2020   Past Surgical History:  Procedure Laterality Date   Dystonia  2017   Social History   Socioeconomic History   Marital status: Married    Spouse name: Not on file   Number of children: 0   Years of education: college   Highest education level: Not on file  Occupational History    Employer: [employer]    Comment: [employer]  Tobacco Use   Smoking status: Former    Types: Cigarettes    Quit date: 06/01/2013    Years since quitting: 8.3   Smokeless tobacco: Never   Tobacco comments:    electronic cigs. 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   Social Determinants of Health   Financial Resource Strain: Not on file  Food Insecurity: Not on file  Transportation Needs:  Not on file  Physical Activity: Not on file  Stress: Not on file  Social Connections: Not on file   Allergies  Allergen Reactions   Amoxicillin Nausea And Vomiting   Sulfate Rash   Family History  Problem Relation Age of Onset   Lung cancer Father    Diabetes Maternal Grandmother    Heart disease Maternal Grandmother    Colon cancer Maternal Grandfather    Heart failure Paternal Grandmother    Heart disease Paternal Grandfather    Cancer Paternal Aunt    Colon cancer Paternal Uncle          Current Outpatient Medications (Other):    baclofen (LIORESAL) 20 MG tablet, TAKE ONE TABLET BY MOUTH THREE TIMES A DAY AS NEEDED FOR MUSCLE SPASMS   diazepam (VALIUM) 5 MG tablet, Take 1 tablet (5 mg total) by mouth every 12 (twelve) hours as needed for anxiety.   Reviewed prior external information including notes and imaging from  primary care provider As well as notes that were available from care everywhere and other healthcare systems.  Past medical history, social, surgical and family history all reviewed in electronic medical record.  No pertanent information unless stated regarding to the chief complaint.   Review of Systems:  No headache, visual changes, nausea, vomiting, diarrhea, constipation, dizziness, abdominal pain, skin rash, fevers, chills, night sweats, weight loss,  swollen lymph nodes, body aches, joint swelling, chest pain, shortness of breath, mood changes. POSITIVE muscle aches  Objective  Blood pressure 136/88, pulse 73, height '6\' 1"'$  (1.854 m), weight 225 lb (102.1 kg), SpO2 99 %.   General: No apparent distress alert and oriented x3 mood and affect normal, dressed appropriately.  HEENT: Pupils equal, extraocular movements intact  Respiratory: Patient's speak in full sentences and does not appear short of breath  Cardiovascular: No lower extremity edema, non tender, no erythema  Gait normal with good balance and coordination.  MSK: Bilateral knee show no  significant findings on exam of any type of inflammation.  Patient does have some mild discomfort noted over the lateral joint space left greater than right.  No instability noted.  Full range of motion though otherwise noted.  Limited muscular skeletal ultrasound was performed and interpreted by Hulan Saas, M  Limited ultrasound of patient's knee show the patient does have what appears to be hypoechoic changes around the left popliteal tendon.  No true increase in neovascularization within the tendon itself.  Patellofemoral has very mild narrowing of the cartilage of the lateral aspect but otherwise unremarkable. Impression: Popliteus tendinitis  [id number]; 15 additional minutes spent for Therapeutic exercises as stated in above notes.  This included exercises focusing on stretching, strengthening, with significant focus on eccentric aspects.   Long term goals include an improvement in range of motion, strength, endurance as well as avoiding reinjury. Patient's frequency would include in 1-2 times a day, 3-5 times a week for a duration of 6-12 weeks.  Reviewed anatomy .  Given rehab exercises handout for VMO, hip abductors, core, entire kinetic chain including proprioception exercises.  Could benefit from PT, regular exercise, upright biking, aProper technique shown and discussed handout in great detail with ATC.  All questions were discussed and answered.      Impression and Recommendations:     The above documentation has been reviewed and is accurate and complete Lyndal Pulley, DO

## 2021-10-17 ENCOUNTER — Ambulatory Visit (INDEPENDENT_AMBULATORY_CARE_PROVIDER_SITE_OTHER): Payer: BC Managed Care – PPO

## 2021-10-17 ENCOUNTER — Ambulatory Visit: Payer: BC Managed Care – PPO | Admitting: Family Medicine

## 2021-10-17 ENCOUNTER — Ambulatory Visit: Payer: Self-pay

## 2021-10-17 VITALS — BP 136/88 | HR 73 | Ht 73.0 in | Wt 225.0 lb

## 2021-10-17 DIAGNOSIS — M76892 Other specified enthesopathies of left lower limb, excluding foot: Secondary | ICD-10-CM

## 2021-10-17 DIAGNOSIS — G8929 Other chronic pain: Secondary | ICD-10-CM

## 2021-10-17 DIAGNOSIS — M25561 Pain in right knee: Secondary | ICD-10-CM

## 2021-10-17 DIAGNOSIS — M25562 Pain in left knee: Secondary | ICD-10-CM | POA: Diagnosis not present

## 2021-10-17 DIAGNOSIS — M76891 Other specified enthesopathies of right lower limb, excluding foot: Secondary | ICD-10-CM | POA: Insufficient documentation

## 2021-10-17 DIAGNOSIS — M2550 Pain in unspecified joint: Secondary | ICD-10-CM

## 2021-10-17 LAB — CBC WITH DIFFERENTIAL/PLATELET
Basophils Absolute: 0.1 10*3/uL (ref 0.0–0.1)
Basophils Relative: 0.9 % (ref 0.0–3.0)
Eosinophils Absolute: 0.1 10*3/uL (ref 0.0–0.7)
Eosinophils Relative: 1 % (ref 0.0–5.0)
HCT: 43.8 % (ref 39.0–52.0)
Hemoglobin: 14.8 g/dL (ref 13.0–17.0)
Lymphocytes Relative: 30.8 % (ref 12.0–46.0)
Lymphs Abs: 2.5 10*3/uL (ref 0.7–4.0)
MCHC: 33.8 g/dL (ref 30.0–36.0)
MCV: 91.6 fl (ref 78.0–100.0)
Monocytes Absolute: 0.6 10*3/uL (ref 0.1–1.0)
Monocytes Relative: 7.2 % (ref 3.0–12.0)
Neutro Abs: 4.9 10*3/uL (ref 1.4–7.7)
Neutrophils Relative %: 60.1 % (ref 43.0–77.0)
Platelets: 295 10*3/uL (ref 150.0–400.0)
RBC: 4.78 Mil/uL (ref 4.22–5.81)
RDW: 13.3 % (ref 11.5–15.5)
WBC: 8.1 10*3/uL (ref 4.0–10.5)

## 2021-10-17 LAB — COMPREHENSIVE METABOLIC PANEL
ALT: 22 U/L (ref 0–53)
AST: 22 U/L (ref 0–37)
Albumin: 4.9 g/dL (ref 3.5–5.2)
Alkaline Phosphatase: 69 U/L (ref 39–117)
BUN: 14 mg/dL (ref 6–23)
CO2: 26 mEq/L (ref 19–32)
Calcium: 9.8 mg/dL (ref 8.4–10.5)
Chloride: 105 mEq/L (ref 96–112)
Creatinine, Ser: 0.9 mg/dL (ref 0.40–1.50)
GFR: 103.65 mL/min (ref >60.00)
Glucose, Bld: 89 mg/dL (ref 70–99)
Potassium: 3.9 mEq/L (ref 3.5–5.1)
Sodium: 139 mEq/L (ref 135–145)
Total Bilirubin: 0.4 mg/dL (ref 0.2–1.2)
Total Protein: 7.5 g/dL (ref 6.0–8.3)

## 2021-10-17 LAB — TSH: TSH: 2.46 u[IU]/mL (ref 0.35–5.50)

## 2021-10-17 LAB — SEDIMENTATION RATE: Sed Rate: 11 mm/hr (ref 0–15)

## 2021-10-17 LAB — IBC PANEL
Iron: 35 ug/dL — ABNORMAL LOW (ref 42–165)
Saturation Ratios: 8.5 % — ABNORMAL LOW (ref 20.0–50.0)
TIBC: 410.2 ug/dL (ref 250.0–450.0)
Transferrin: 293 mg/dL (ref 212.0–360.0)

## 2021-10-17 LAB — URIC ACID: Uric Acid, Serum: 5.5 mg/dL (ref 4.0–7.8)

## 2021-10-17 LAB — FERRITIN: Ferritin: 64.9 ng/mL (ref 22.0–322.0)

## 2021-10-17 LAB — TESTOSTERONE: Testosterone: 162.33 ng/dL — ABNORMAL LOW (ref 300.00–890.00)

## 2021-10-17 NOTE — Patient Instructions (Addendum)
Do prescribed exercises at least 3x a week Avoid full extension and flexion Ice and Voltaren Sleep study referred See you again in 6-8 weeks

## 2021-10-17 NOTE — Assessment & Plan Note (Signed)
Left greater than right, discussed home exercises, discussed hamstring strengthening, discussed avoiding full flexion and full extension.  Patient has noticed that most of the time it happens when he is in full flexion of the knee.  On ultrasound no significant swelling noted in the knee at the moment.  No significant findings of arthritic changes either.  Patient is going to try the conservative therapy and follow-up again in 6 to 8 weeks.  Patient will follow-up at that time and we will discuss other treatment options if necessary.

## 2021-10-20 LAB — PTH, INTACT AND CALCIUM
Calcium: 9.9 mg/dL (ref 8.6–10.3)
PTH: 36 pg/mL (ref 16–77)

## 2021-10-20 LAB — CALCIUM, IONIZED: Calcium, Ion: 4.9 mg/dL (ref 4.7–5.5)

## 2021-11-27 NOTE — Progress Notes (Signed)
David Silva [address] [place] Phone: [phone] Subjective:   [name], am serving as a scribe for Dr. Hulan Saas.  I'm seeing this patient by the request  of:  [name], [place]  CC: Bilateral knee pain follow-up, ankle pain follow-up  [id number]  10/17/2021 Left greater than right, discussed home exercises, discussed hamstring strengthening, discussed avoiding full flexion and full extension.  Patient has noticed that most of the time it happens when he is in full flexion of the knee.  On ultrasound no significant swelling noted in the knee at the moment.  No significant findings of arthritic changes either.  Patient is going to try the conservative therapy and follow-up again in 6 to 8 weeks.  Patient will follow-up at that time and we will discuss other treatment options if necessary.  Updated 11/28/2021 David Silva is a 46 y.o. male coming in with complaint of knee pain. Same as the last time. Would like labs again. Think he is sleeping better and has a bit more energy. No other complaints       Past Medical History:  Diagnosis Date   Allergy    COVID-19 12/25/2020   Past Surgical History:  Procedure Laterality Date   Dystonia  2017   Social History   Socioeconomic History   Marital status: Married    Spouse name: Not on file   Number of children: 0   Years of education: college   Highest education level: Not on file  Occupational History    Employer: [employer]    Comment: [employer]  Tobacco Use   Smoking status: Former    Types: Cigarettes    Quit date: 06/01/2013    Years since quitting: 8.4   Smokeless tobacco: Never   Tobacco comments:    electronic cigs. 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   Social Determinants of Health   Financial Resource Strain: Not on file  Food Insecurity: Not on file  Transportation Needs: Not on file  Physical Activity: Not on file  Stress: Not on file  Social Connections: Not on file   Allergies  Allergen Reactions   Amoxicillin Nausea And Vomiting   Sulfate Rash   Family History  Problem Relation Age of Onset   Lung cancer Father    Diabetes Maternal Grandmother    Heart disease Maternal Grandmother    Colon cancer Maternal Grandfather    Heart failure Paternal Grandmother    Heart disease Paternal Grandfather    Cancer Paternal Aunt    Colon cancer Paternal Uncle          Current Outpatient Medications (Other):    baclofen (LIORESAL) 20 MG tablet, TAKE ONE TABLET BY MOUTH THREE TIMES A DAY AS NEEDED FOR MUSCLE SPASMS   diazepam (VALIUM) 5 MG tablet, Take 1 tablet (5 mg total) by mouth every 12 (twelve) hours as needed for anxiety.   Reviewed prior external information including notes and imaging from  primary care provider As well as notes that were available from care everywhere and other healthcare systems.  Past medical history, social,  surgical and family history all reviewed in electronic medical record.  No pertanent information unless stated regarding to the chief complaint.   Review of Systems:  No headache, visual changes, nausea, vomiting, diarrhea, constipation, dizziness, abdominal pain, skin rash, fevers, chills, night sweats, weight loss, swollen lymph nodes, body aches, joint swelling, chest pain, shortness of breath, mood changes. POSITIVE muscle aches, body aches but improving  Objective  Blood pressure 124/82, pulse 86, height '6\' 1"'$  (1.854 m), weight 224 lb (101.6 kg), SpO2 98 %.   General: No apparent distress alert and oriented x3 mood and affect normal, dressed appropriately.  HEENT: Pupils equal,  extraocular movements intact  Respiratory: Patient's speak in full sentences and does not appear short of breath  Cardiovascular: No lower extremity edema, non tender, no erythema  Bilateral knees still have tender to palpation over the lateral gastrocnemius in the popliteal area.  Patient has no instability though noted of the knee today.  Worsening pain with full flexion and full extension.    Impression and Recommendations:     The above documentation has been reviewed and is accurate and complete Lyndal Pulley, DO

## 2021-11-28 ENCOUNTER — Ambulatory Visit: Payer: BC Managed Care – PPO | Admitting: Family Medicine

## 2021-11-28 VITALS — BP 124/82 | HR 86 | Ht 73.0 in | Wt 224.0 lb

## 2021-11-28 DIAGNOSIS — M76891 Other specified enthesopathies of right lower limb, excluding foot: Secondary | ICD-10-CM

## 2021-11-28 DIAGNOSIS — M76892 Other specified enthesopathies of left lower limb, excluding foot: Secondary | ICD-10-CM

## 2021-11-28 DIAGNOSIS — M2550 Pain in unspecified joint: Secondary | ICD-10-CM | POA: Diagnosis not present

## 2021-11-28 DIAGNOSIS — R7989 Other specified abnormal findings of blood chemistry: Secondary | ICD-10-CM | POA: Diagnosis not present

## 2021-11-28 LAB — IBC PANEL
Iron: 99 ug/dL (ref 42–165)
Saturation Ratios: 23.5 % (ref 20.0–50.0)
TIBC: 421.4 ug/dL (ref 250.0–450.0)
Transferrin: 301 mg/dL (ref 212.0–360.0)

## 2021-11-28 LAB — FERRITIN: Ferritin: 60.5 ng/mL (ref 22.0–322.0)

## 2021-11-28 NOTE — Assessment & Plan Note (Signed)
Patient is doing significantly better at this time.  Discussed compression when necessary.  Discussed activities to do and which ones to avoid.  Patient has done significantly better.  Does have some overpronation noted of the hindfoot today as well.  Follow-up with me again in12 weeks

## 2021-11-28 NOTE — Assessment & Plan Note (Signed)
Recheck again today.  If still low consider referral to endocrinology

## 2021-11-28 NOTE — Patient Instructions (Signed)
[facility] [employer] Body Helix for knee compression Glad you're doing well Labs today See you again in 3 months

## 2021-12-03 LAB — TESTOSTERONE,FREE AND TOTAL
Testosterone, Free: 9.1 pg/mL (ref 6.8–21.5)
Testosterone: 214 ng/dL — ABNORMAL LOW (ref 264–916)

## 2021-12-16 ENCOUNTER — Other Ambulatory Visit: Payer: Self-pay | Admitting: Physician Assistant

## 2021-12-16 DIAGNOSIS — G249 Dystonia, unspecified: Secondary | ICD-10-CM

## 2022-02-19 ENCOUNTER — Other Ambulatory Visit: Payer: Self-pay | Admitting: Physician Assistant

## 2022-02-19 DIAGNOSIS — G249 Dystonia, unspecified: Secondary | ICD-10-CM

## 2022-02-23 ENCOUNTER — Encounter: Payer: Self-pay | Admitting: *Deleted

## 2022-02-26 NOTE — Progress Notes (Signed)
[facility] Tareva [place] [address] [place] Phone: [phone] Subjective:   [name], am serving as a scribe for Dr. Hulan Saas.  I'm seeing this patient by the request  of:  [name], [place]  CC: knee pain follow up   [id number]  11/28/2021 Recheck again today.  If still low consider referral to endocrinology  Patient is doing significantly better at this time.  Discussed compression when necessary.  Discussed activities to do and which ones to avoid.  Patient has done significantly better.  Does have some overpronation noted of the hindfoot today as well.  Follow-up with me again in12 weeks  Updated 03/03/2022 Trustin Chapa is a 46 y.o. male coming in with complaint of bilateral popliteus tendonitis. Doing well. No pain today. Would like to go over lab results, specifically testosterone.       Past Medical History:  Diagnosis Date   Allergy    COVID-19 12/25/2020   Past Surgical History:  Procedure Laterality Date   Dystonia  2017   Social History   Socioeconomic History   Marital status: Married    Spouse name: Not on file   Number of children: 0   Years of education: college   Highest education level: Not on file  Occupational History    Employer: [employer]    Comment: [employer]  Tobacco Use   Smoking status: Former    Types: Cigarettes    Quit date: 06/01/2013    Years since quitting: 8.7   Smokeless tobacco: Never   Tobacco comments:    electronic cigs. 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   Social Determinants of Health   Financial Resource Strain: Not on file  Food Insecurity: Not on file   Transportation Needs: Not on file  Physical Activity: Not on file  Stress: Not on file  Social Connections: Not on file   Allergies  Allergen Reactions   Amoxicillin Nausea And Vomiting   Sulfate Rash   Family History  Problem Relation Age of Onset   Lung cancer Father    Diabetes Maternal Grandmother    Heart disease Maternal Grandmother    Colon cancer Maternal Grandfather    Heart failure Paternal Grandmother    Heart disease Paternal Grandfather    Cancer Paternal Aunt    Colon cancer Paternal Uncle          Current Outpatient Medications (Other):    baclofen (LIORESAL) 20 MG tablet, TAKE ONE TABLET BY MOUTH THREE TIMES A DAY AS NEEDED FOR MUSCLE SPASMS   DHEA 50 MG CAPS, Take 1 capsule by mouth. Take one capsule for 3 weeks and off x 1 week.   diazepam (VALIUM) 5 MG tablet, Take 1 tablet (5 mg total) by mouth every 12 (twelve) hours as needed for anxiety.    Review of Systems:  No headache, visual changes, nausea, vomiting, diarrhea, constipation, dizziness, abdominal pain, skin rash, fevers, chills, night sweats, weight loss, swollen lymph nodes, body aches, joint swelling, chest pain, shortness of breath, mood changes. POSITIVE muscle aches but improvement  Objective  Blood pressure 102/74, pulse 73,  height '6\' 1"'$  (1.854 m), weight 226 lb (102.5 kg), SpO2 98 %.   General: No apparent distress alert and oriented x3 mood and affect normal, dressed appropriately.  HEENT: Pupils equal, extraocular movements intact  Respiratory: Patient's speak in full sentences and does not appear short of breath  Cardiovascular: No lower extremity edema, non tender, no erythema  No pain in the knees with full range of motion.  Patient is able to get out of his seat without any significant difficulty.    Impression and Recommendations:     The above documentation has been reviewed and is accurate and complete Lyndal Pulley, DO

## 2022-02-27 ENCOUNTER — Encounter: Payer: Self-pay | Admitting: Physician Assistant

## 2022-02-27 ENCOUNTER — Ambulatory Visit (INDEPENDENT_AMBULATORY_CARE_PROVIDER_SITE_OTHER): Payer: BC Managed Care – PPO | Admitting: Physician Assistant

## 2022-02-27 VITALS — BP 120/80 | HR 70 | Temp 97.8°F | Ht 73.0 in | Wt 223.0 lb

## 2022-02-27 DIAGNOSIS — Z0000 Encounter for general adult medical examination without abnormal findings: Secondary | ICD-10-CM | POA: Diagnosis not present

## 2022-02-27 DIAGNOSIS — E663 Overweight: Secondary | ICD-10-CM | POA: Diagnosis not present

## 2022-02-27 DIAGNOSIS — R7989 Other specified abnormal findings of blood chemistry: Secondary | ICD-10-CM | POA: Diagnosis not present

## 2022-02-27 DIAGNOSIS — Z1211 Encounter for screening for malignant neoplasm of colon: Secondary | ICD-10-CM

## 2022-02-27 DIAGNOSIS — Z23 Encounter for immunization: Secondary | ICD-10-CM | POA: Diagnosis not present

## 2022-02-27 DIAGNOSIS — G249 Dystonia, unspecified: Secondary | ICD-10-CM

## 2022-02-27 DIAGNOSIS — Z136 Encounter for screening for cardiovascular disorders: Secondary | ICD-10-CM

## 2022-02-27 DIAGNOSIS — Z1159 Encounter for screening for other viral diseases: Secondary | ICD-10-CM

## 2022-02-27 DIAGNOSIS — Z13220 Encounter for screening for lipoid disorders: Secondary | ICD-10-CM

## 2022-02-27 DIAGNOSIS — Z720 Tobacco use: Secondary | ICD-10-CM | POA: Diagnosis not present

## 2022-02-27 LAB — COMPREHENSIVE METABOLIC PANEL
ALT: 35 U/L (ref 0–53)
AST: 28 U/L (ref 0–37)
Albumin: 4.8 g/dL (ref 3.5–5.2)
Alkaline Phosphatase: 68 U/L (ref 39–117)
BUN: 11 mg/dL (ref 6–23)
CO2: 28 mEq/L (ref 19–32)
Calcium: 10 mg/dL (ref 8.4–10.5)
Chloride: 103 mEq/L (ref 96–112)
Creatinine, Ser: 1.01 mg/dL (ref 0.40–1.50)
GFR: 90.03 mL/min (ref >60.00)
Glucose, Bld: 96 mg/dL (ref 70–99)
Potassium: 4.3 mEq/L (ref 3.5–5.1)
Sodium: 139 mEq/L (ref 135–145)
Total Bilirubin: 1.4 mg/dL — ABNORMAL HIGH (ref 0.2–1.2)
Total Protein: 7.4 g/dL (ref 6.0–8.3)

## 2022-02-27 LAB — CBC WITH DIFFERENTIAL/PLATELET
Basophils Absolute: 0.1 10*3/uL (ref 0.0–0.1)
Basophils Relative: 0.9 % (ref 0.0–3.0)
Eosinophils Absolute: 0.1 10*3/uL (ref 0.0–0.7)
Eosinophils Relative: 1.1 % (ref 0.0–5.0)
HCT: 45.7 % (ref 39.0–52.0)
Hemoglobin: 15.4 g/dL (ref 13.0–17.0)
Lymphocytes Relative: 26.2 % (ref 12.0–46.0)
Lymphs Abs: 1.6 10*3/uL (ref 0.7–4.0)
MCHC: 33.7 g/dL (ref 30.0–36.0)
MCV: 90.6 fl (ref 78.0–100.0)
Monocytes Absolute: 0.6 10*3/uL (ref 0.1–1.0)
Monocytes Relative: 9.2 % (ref 3.0–12.0)
Neutro Abs: 3.9 10*3/uL (ref 1.4–7.7)
Neutrophils Relative %: 62.6 % (ref 43.0–77.0)
Platelets: 291 10*3/uL (ref 150.0–400.0)
RBC: 5.05 Mil/uL (ref 4.22–5.81)
RDW: 14 % (ref 11.5–15.5)
WBC: 6.2 10*3/uL (ref 4.0–10.5)

## 2022-02-27 LAB — LIPID PANEL
Cholesterol: 187 mg/dL (ref 0–200)
HDL: 36.8 mg/dL — ABNORMAL LOW (ref >39.00)
LDL Cholesterol: 129 mg/dL — ABNORMAL HIGH (ref 0–99)
NonHDL: 150.17
Total CHOL/HDL Ratio: 5
Triglycerides: 104 mg/dL (ref 0.0–149.0)
VLDL: 20.8 mg/dL (ref 0.0–40.0)

## 2022-02-27 MED ORDER — BACLOFEN 20 MG PO TABS: ORAL_TABLET | ORAL | 3 refills | Status: DC

## 2022-02-27 MED ORDER — DIAZEPAM 5 MG PO TABS: 5.0000 mg | ORAL_TABLET | Freq: Two times a day (BID) | ORAL | 1 refills | Status: DC | PRN

## 2022-02-27 NOTE — Progress Notes (Signed)
Subjective:    David Silva is a 46 y.o. male and is here for a comprehensive physical exam.  HPI  Health Maintenance Due  Topic Date Due   Hepatitis C Screening  Never done   COLONOSCOPY (Pts 45-60yr Insurance coverage will need to be confirmed)  Never done   INFLUENZA VACCINE  12/30/2021    Acute Concerns: None  Chronic Issues: Low testosterone -- followed by ZCharlann Boxer Doing really well with supplements. Feels energy improving.  Dystonia -- stable. Taking baclofen TID most days. Valium intake is rare. Needs refill. Denies new sx.  Health Maintenance: Immunizations -- flu shot today Colonoscopy -- DUE PSA -- No results found for: "PSA1", "PSA" Diet -- eats well, rare soda Sleep habits -- needs a sleep study; wakes up a l Exercise -- dance a few times per week, walking regularly  Weight -- Weight: 223 lb (101.2 kg)  Recent weight history Wt Readings from Last 10 Encounters:  02/27/22 223 lb (101.2 kg)  11/28/21 224 lb (101.6 kg)  10/17/21 225 lb (102.1 kg)  01/06/21 232 lb (105.2 kg)  10/02/19 234 lb 1.6 oz (106.2 kg)  01/13/19 229 lb (103.9 kg)  09/12/18 233 lb (105.7 kg)  06/07/18 233 lb (105.7 kg)  12/31/17 230 lb 3.2 oz (104.4 kg)  08/27/17 226 lb (102.5 kg)   Body mass index is 29.42 kg/m.  Mood -- no major concerns Alcohol use --  reports current alcohol use.  Tobacco use --  Tobacco Use: Medium Risk (02/27/2022)   Patient History    Smoking Tobacco Use: Former    Smokeless Tobacco Use: Never    Passive Exposure: Not on file    Eligible for Low Dose CT?  UTD with eye doctor? no UTD with dentist? no     02/27/2022    8:15 AM  Depression screen PHQ 2/9  Decreased Interest 0  Down, Depressed, Hopeless 0  PHQ - 2 Score 0    Other providers/specialists: Patient Care Team: WInda Coke PUtahas PCP - General (Physician Assistant) TSonia Baller MD as Referring Physician (Neurology)    PMHx, SurgHx, SocialHx,  Medications, and Allergies were reviewed in the Visit Navigator and updated as appropriate.   Past Medical History:  Diagnosis Date   Allergy    COVID-19 12/25/2020     Past Surgical History:  Procedure Laterality Date   Dystonia  2017     Family History  Problem Relation Age of Onset   Lung cancer Father    Diabetes Maternal Grandmother    Heart disease Maternal Grandmother    Colon cancer Maternal Grandfather    Heart failure Paternal Grandmother    Heart disease Paternal Grandfather    Cancer Paternal Aunt    Colon cancer Paternal Uncle     Social History   Tobacco Use   Smoking status: Former    Types: Cigarettes    Quit date: 06/01/2013    Years since quitting: 8.7   Smokeless tobacco: Never   Tobacco comments:    electronic cigs. daily  Vaping Use   Vaping Use: Every day  Substance Use Topics   Alcohol use: Yes    Comment: 4 drinks per  yearly   Drug use: No    Review of Systems:   Review of Systems  Constitutional:  Negative for chills, fever, malaise/fatigue and weight loss.  HENT:  Negative for hearing loss, sinus pain and sore throat.   Respiratory:  Negative for cough and hemoptysis.  Cardiovascular:  Negative for chest pain, palpitations, leg swelling and PND.  Gastrointestinal:  Negative for abdominal pain, constipation, diarrhea, heartburn, nausea and vomiting.  Genitourinary:  Negative for dysuria, frequency and urgency.  Musculoskeletal:  Negative for back pain, myalgias and neck pain.  Skin:  Negative for itching and rash.  Neurological:  Negative for dizziness, tingling, seizures and headaches.  Endo/Heme/Allergies:  Negative for polydipsia.  Psychiatric/Behavioral:  Negative for depression. The patient is not nervous/anxious.     Objective:    Vitals:   02/27/22 0816  BP: 120/80  Pulse: 70  Temp: 97.8 F (36.6 C)  SpO2: 96%    Body mass index is 29.42 kg/m.  General  Alert, cooperative, no distress, appears stated age   Head:  Normocephalic, without obvious abnormality, atraumatic  Eyes:  PERRL, conjunctiva/corneas clear, EOM's intact, fundi benign, both eyes       Ears:  Normal TM's and external ear canals, both ears  Nose: Nares normal, septum midline, mucosa normal, no drainage or sinus tenderness  Throat: Lips, mucosa, and tongue normal; teeth and gums normal  Neck: Supple, symmetrical, trachea midline, no adenopathy;     thyroid:  No enlargement/tenderness/nodules; no carotid bruit or JVD  Back:   Symmetric, no curvature, ROM normal, no CVA tenderness  Lungs:   Clear to auscultation bilaterally, respirations unlabored  Chest wall:  No tenderness or deformity  Heart:  Regular rate and rhythm, S1 and S2 normal, no murmur, rub or gallop  Abdomen:   Soft, non-tender, bowel sounds active all four quadrants, no masses, no organomegaly  Extremities: Extremities normal, atraumatic, no cyanosis or edema  Prostate : Deferred   Skin: Skin color, texture, turgor normal, no rashes or lesions  Lymph nodes: Cervical, supraclavicular, and axillary nodes normal  Neurologic: CNII-XII grossly intact. Normal strength, sensation and reflexes throughout   AssessmentPlan:   Routine physical examination Today patient counseled on age appropriate routine health concerns for screening and prevention, each reviewed and up to date or declined. Immunizations reviewed and up to date or declined. Labs ordered and reviewed. Risk factors for depression reviewed and negative. Hearing function and visual acuity are intact. ADLs screened and addressed as needed. Functional ability and level of safety reviewed and appropriate. Education, counseling and referrals performed based on assessed risks today. Patient provided with a copy of personalized plan for preventive services.  Current every day nicotine vaping Counseled Has no desire to reduce smoking  Dystonia Stable Refill valium and baclofen  Low testosterone in male Sx  improving Recheck levels today per patient request  Overweight Improving Continue healthy diet and exercise  Encounter for lipid screening for cardiovascular disease Update today and provide recommendations accordingly  Encounter for screening for other viral diseases Update today  Special screening for malignant neoplasms, colon Referral placed    [name], PA-C [place]

## 2022-02-27 NOTE — Patient Instructions (Signed)
It was great to see you!  Let's get your colonoscopy scheduled -- referral placed Keep up the good work on your exercise  Please work on reduction of vaping  Please go to the lab for blood work.   Our office will call you with your results unless you have chosen to receive results via MyChart.  If your blood work is normal we will follow-up each year for physicals and as scheduled for chronic medical problems.  If anything is abnormal we will treat accordingly and get you in for a follow-up.  Take care,  [place]ona Bar

## 2022-03-02 ENCOUNTER — Other Ambulatory Visit: Payer: Self-pay | Admitting: Physician Assistant

## 2022-03-02 DIAGNOSIS — R17 Unspecified jaundice: Secondary | ICD-10-CM

## 2022-03-02 LAB — HEPATITIS C ANTIBODY: Hepatitis C Ab: NONREACTIVE

## 2022-03-03 ENCOUNTER — Ambulatory Visit: Payer: BC Managed Care – PPO | Admitting: Family Medicine

## 2022-03-03 VITALS — BP 102/74 | HR 73 | Ht 73.0 in | Wt 226.0 lb

## 2022-03-03 DIAGNOSIS — M76891 Other specified enthesopathies of right lower limb, excluding foot: Secondary | ICD-10-CM | POA: Diagnosis not present

## 2022-03-03 DIAGNOSIS — M76892 Other specified enthesopathies of left lower limb, excluding foot: Secondary | ICD-10-CM

## 2022-03-03 DIAGNOSIS — M2550 Pain in unspecified joint: Secondary | ICD-10-CM

## 2022-03-03 DIAGNOSIS — R7989 Other specified abnormal findings of blood chemistry: Secondary | ICD-10-CM | POA: Diagnosis not present

## 2022-03-03 NOTE — Assessment & Plan Note (Signed)
Continues to have low testosterone.  Discussed potentially 1 more round of DHEA at a higher level to see how he does.  Has made some improvements.  Follow-up with me again in 6 to 8 weeks.  At that point we will retest the testosterone again and make sure patient is improving otherwise consider referral to urology or endocrinology

## 2022-03-03 NOTE — Assessment & Plan Note (Signed)
Significant improvement noted.  Discussed home exercises and icing regimen, discussed which activities to do which ones to avoid.  Patient at this point can follow-up as needed for this problem.

## 2022-03-03 NOTE — Patient Instructions (Addendum)
Good to see you! Glad you're doing better Next round of DHEA '100mg'$  for 5 weeks, then 2 weeks off Get blood draw at [facility]

## 2022-03-08 LAB — TESTOSTERONE,FREE AND TOTAL
Testosterone, Free: 8 pg/mL (ref 6.8–21.5)
Testosterone: 234 ng/dL — ABNORMAL LOW (ref 264–916)

## 2022-03-24 ENCOUNTER — Encounter: Payer: Self-pay | Admitting: Physician Assistant

## 2022-05-01 NOTE — Progress Notes (Unsigned)
[place] [address] [place] Phone: [phone] Subjective:    I'm seeing this patient by the request  of:  [name], [place]  CC: bilateral knee pain and more   [id number]  03/03/2022 Continues to have low testosterone.  Discussed potentially 1 more round of DHEA at a higher level to see how he does.  Has made some improvements.  Follow-up with me again in 6 to 8 weeks.  At that point we will retest the testosterone again and make sure patient is improving otherwise consider referral to urology or endocrinology     Update 05/05/2022 David Silva is a 46 y.o. male coming in with complaint of B knee pain.  Patient at last exam was having more polyarthralgia and also has low testosterone.  Found to have popliteal tendinitis and was doing home exercises.  Patient states continues to have discomfort and pain.  Patient states that it is all over.  Knee pain seems to be doing maybe a little bit better.  Noticed maybe with a little bit of increase in energy with the DHEA but with her relatively no significant changes.  Still have pain more than what would be anticipated.       Past Medical History:  Diagnosis Date   Allergy    COVID-19 12/25/2020   Past Surgical History:  Procedure Laterality Date   Dystonia  2017    Social History   Socioeconomic History   Marital status: Married    Spouse name: Not on file   Number of children: 0   Years of education: college   Highest education level: Not on file  Occupational History    Employer: [employer]    Comment: [employer]  Tobacco Use   Smoking status: Former    Types: Cigarettes    Quit date: 06/01/2013    Years since quitting: 8.9   Smokeless tobacco: Never   Tobacco comments:    electronic cigs. 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   Social Determinants of Health   Financial Resource Strain: Not on file  Food Insecurity: Not on file  Transportation Needs: Not on file  Physical Activity: Not on file  Stress: Not on file  Social Connections: Not on file   Allergies  Allergen Reactions   Amoxicillin Nausea And Vomiting   Sulfate Rash   Family History  Problem Relation Age of Onset   Lung cancer Father    Diabetes Maternal Grandmother    Heart disease Maternal Grandmother    Colon cancer Maternal Grandfather    Heart failure Paternal Grandmother    Heart disease Paternal Grandfather    Cancer Paternal Aunt    Colon cancer Paternal Uncle          Current Outpatient Medications (Other):    baclofen (LIORESAL) 20 MG tablet, TAKE ONE TABLET BY MOUTH THREE TIMES A DAY AS NEEDED FOR MUSCLE SPASMS   DHEA 50 MG CAPS, Take 1 capsule by mouth. Take one capsule for 3 weeks and off x 1 week.   diazepam (VALIUM) 5 MG tablet, Take 1 tablet (5 mg  total) by mouth every 12 (twelve) hours as needed for anxiety.   Reviewed prior external information including notes and imaging from  primary care provider As well as notes that were available from care everywhere and other healthcare systems.  Past medical history, social, surgical and family history all reviewed in electronic medical record.  No pertanent information unless stated regarding to the chief complaint.   Review of Systems:  No headache, visual changes, nausea, vomiting, diarrhea, constipation, dizziness, abdominal pain, skin rash, fevers, chills, night sweats, weight loss, swollen lymph nodes, joint swelling, chest pain, shortness of breath, mood changes. POSITIVE muscle aches, body aches  Objective  Weight 222 lb 9.6 oz (101 kg).   General: No apparent distress alert and oriented x3 mood  and affect normal, dressed appropriately.  HEENT: Pupils equal, extraocular movements intact  Respiratory: Patient's speak in full sentences and does not appear short of breath  Cardiovascular: No lower extremity edema, non tender, no erythema  Knee exam shows still tender to palpation overall.  Patient is able to get out of the chair without any significant difficulty.  Patient does seem to move because he is uncomfortable on a regular basis.  No significant instability noted.    Impression and Recommendations:     The above documentation has been reviewed and is accurate and complete Lyndal Pulley, DO

## 2022-05-05 ENCOUNTER — Ambulatory Visit: Payer: BC Managed Care – PPO | Admitting: Family Medicine

## 2022-05-05 VITALS — Wt 222.6 lb

## 2022-05-05 DIAGNOSIS — R7989 Other specified abnormal findings of blood chemistry: Secondary | ICD-10-CM | POA: Diagnosis not present

## 2022-05-05 DIAGNOSIS — M76892 Other specified enthesopathies of left lower limb, excluding foot: Secondary | ICD-10-CM | POA: Diagnosis not present

## 2022-05-05 DIAGNOSIS — M76891 Other specified enthesopathies of right lower limb, excluding foot: Secondary | ICD-10-CM

## 2022-05-05 DIAGNOSIS — M2550 Pain in unspecified joint: Secondary | ICD-10-CM

## 2022-05-05 LAB — TESTOSTERONE: Testosterone: 235.47 ng/dL — ABNORMAL LOW (ref 300.00–890.00)

## 2022-05-05 NOTE — Patient Instructions (Signed)
Good to see you!  Get your labs today. I'll be in touch with the results & next steps.   See me again in 2-3 months.

## 2022-05-06 NOTE — Assessment & Plan Note (Signed)
Stable overall, do not think it would change management at this time.  Multiple other things are contributing to patient decreasing activity more than the knees at the moment.

## 2022-05-06 NOTE — Assessment & Plan Note (Signed)
History of a low testosterone.  Will recheck to see if he is responding at all to the [place] otherwise if continuing to be low I would like to refer patient to urology or endocrinology.  This could be contributing to the exacerbation in the amplification of patient's pain.

## 2022-07-16 ENCOUNTER — Encounter: Payer: Self-pay | Admitting: Physician Assistant

## 2022-07-16 ENCOUNTER — Telehealth: Payer: BC Managed Care – PPO | Admitting: Physician Assistant

## 2022-07-16 DIAGNOSIS — J029 Acute pharyngitis, unspecified: Secondary | ICD-10-CM | POA: Diagnosis not present

## 2022-07-16 MED ORDER — AZITHROMYCIN 250 MG PO TABS: ORAL_TABLET | ORAL | 0 refills | Status: AC

## 2022-07-16 NOTE — Progress Notes (Signed)
   Virtual Visit via Video Note   I, David Silva, connected with  David Silva  ([id number], 07-05-1975) on 07/16/22 at 11:20 AM EST by a video-enabled telemedicine application and verified that I am speaking with the correct person using two identifiers.  Location: Patient: Home Provider: [place] office   I discussed the limitations of evaluation and management by telemedicine and the availability of in person appointments. The patient expressed understanding and agreed to proceed.    History of Present Illness: David Silva is a 47 y.o. who identifies as a male who was assigned male at birth, and is being seen today for sore throat.  Patient reports sore throat since yesterday. Has fatigue, malaise, nasal congestion, sore throat, LAD. He can see white spots on his tonsils.  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 Take one capsule for 3 weeks and off x 1 week., Disp: , Rfl:    diazepam (VALIUM) 5 MG tablet, Take 1 tablet (5 mg total) by mouth every 12 (twelve) hours as needed for anxiety., Disp: 30 tablet, Rfl: 1  Observations/Objective: Patient is well-developed, well-nourished in no acute distress.  Resting comfortably  at home.  Head  is normocephalic, atraumatic.  No labored breathing.  Speech is clear and coherent with logical content.  Patient is alert and oriented at baseline.   Assessment and Plan: 1. Pharyngitis, unspecified etiology No red flags on exam.  Will initiate azithromycin as he his PCN allergic per orders. Discussed taking medications as prescribed. Reviewed return precautions including worsening fever, SOB, worsening cough or other concerns. Push fluids and rest. I recommend that patient follow-up if symptoms worsen or persist despite treatment x 7-10 days, sooner if needed.  Follow Up Instructions: I discussed the assessment and treatment plan with the patient. The patient was provided an opportunity to ask questions and all were answered. The patient agreed with the plan and demonstrated an understanding of the instructions.  A copy of instructions were sent to the patient via MyChart unless otherwise noted below.   The patient was advised to call back or seek an in-person evaluation if the symptoms worsen or if the condition fails to improve as anticipated.  David Silva, [place]

## 2022-11-16 ENCOUNTER — Other Ambulatory Visit: Payer: Self-pay | Admitting: Physician Assistant

## 2022-11-16 DIAGNOSIS — G249 Dystonia, unspecified: Secondary | ICD-10-CM

## 2022-11-17 NOTE — Telephone Encounter (Signed)
Last OV: 07/16/22 VV  Next OV: none scheduled  Last filled: 02/27/22  Quantity: 30 w/ 1 refill

## 2023-02-24 ENCOUNTER — Other Ambulatory Visit: Payer: Self-pay | Admitting: Physician Assistant

## 2023-02-24 DIAGNOSIS — G249 Dystonia, unspecified: Secondary | ICD-10-CM

## 2023-02-25 NOTE — Telephone Encounter (Signed)
Please let patient know that this is the last refill he will receive until he can come into the office for an office visit

## 2023-02-25 NOTE — Telephone Encounter (Signed)
Pt requesting refill for Valium 5 mg. Last OV 02/27/2022

## 2023-06-18 ENCOUNTER — Encounter: Payer: Self-pay | Admitting: Physician Assistant

## 2023-06-18 ENCOUNTER — Ambulatory Visit (INDEPENDENT_AMBULATORY_CARE_PROVIDER_SITE_OTHER): Payer: BC Managed Care – PPO | Admitting: Physician Assistant

## 2023-06-18 VITALS — BP 110/80 | HR 81 | Temp 98.4°F | Ht 73.0 in | Wt 238.0 lb

## 2023-06-18 DIAGNOSIS — Z13220 Encounter for screening for lipoid disorders: Secondary | ICD-10-CM | POA: Diagnosis not present

## 2023-06-18 DIAGNOSIS — Z1211 Encounter for screening for malignant neoplasm of colon: Secondary | ICD-10-CM

## 2023-06-18 DIAGNOSIS — Z0000 Encounter for general adult medical examination without abnormal findings: Secondary | ICD-10-CM | POA: Diagnosis not present

## 2023-06-18 DIAGNOSIS — G249 Dystonia, unspecified: Secondary | ICD-10-CM | POA: Diagnosis not present

## 2023-06-18 DIAGNOSIS — Z136 Encounter for screening for cardiovascular disorders: Secondary | ICD-10-CM | POA: Diagnosis not present

## 2023-06-18 DIAGNOSIS — R7989 Other specified abnormal findings of blood chemistry: Secondary | ICD-10-CM | POA: Diagnosis not present

## 2023-06-18 DIAGNOSIS — E66811 Obesity, class 1: Secondary | ICD-10-CM

## 2023-06-18 LAB — COMPREHENSIVE METABOLIC PANEL
ALT: 30 U/L (ref 0–53)
AST: 29 U/L (ref 0–37)
Albumin: 4.7 g/dL (ref 3.5–5.2)
Alkaline Phosphatase: 76 U/L (ref 39–117)
BUN: 14 mg/dL (ref 6–23)
CO2: 25 meq/L (ref 19–32)
Calcium: 9.5 mg/dL (ref 8.4–10.5)
Chloride: 106 meq/L (ref 96–112)
Creatinine, Ser: 0.88 mg/dL (ref 0.40–1.50)
GFR: 103.14 mL/min (ref >60.00)
Glucose, Bld: 90 mg/dL (ref 70–99)
Potassium: 4.2 meq/L (ref 3.5–5.1)
Sodium: 138 meq/L (ref 135–145)
Total Bilirubin: 0.5 mg/dL (ref 0.2–1.2)
Total Protein: 7.1 g/dL (ref 6.0–8.3)

## 2023-06-18 LAB — LIPID PANEL
Cholesterol: 193 mg/dL (ref 0–200)
HDL: 33.5 mg/dL — ABNORMAL LOW (ref >39.00)
LDL Cholesterol: 125 mg/dL — ABNORMAL HIGH (ref 0–99)
NonHDL: 159.62
Total CHOL/HDL Ratio: 6
Triglycerides: 172 mg/dL — ABNORMAL HIGH (ref 0.0–149.0)
VLDL: 34.4 mg/dL (ref 0.0–40.0)

## 2023-06-18 LAB — CBC WITH DIFFERENTIAL/PLATELET
Basophils Absolute: 0.1 10*3/uL (ref 0.0–0.1)
Basophils Relative: 0.7 % (ref 0.0–3.0)
Eosinophils Absolute: 0.1 10*3/uL (ref 0.0–0.7)
Eosinophils Relative: 0.8 % (ref 0.0–5.0)
HCT: 47.5 % (ref 39.0–52.0)
Hemoglobin: 16 g/dL (ref 13.0–17.0)
Lymphocytes Relative: 26.8 % (ref 12.0–46.0)
Lymphs Abs: 2.3 10*3/uL (ref 0.7–4.0)
MCHC: 33.7 g/dL (ref 30.0–36.0)
MCV: 90.9 fL (ref 78.0–100.0)
Monocytes Absolute: 0.6 10*3/uL (ref 0.1–1.0)
Monocytes Relative: 7.2 % (ref 3.0–12.0)
Neutro Abs: 5.4 10*3/uL (ref 1.4–7.7)
Neutrophils Relative %: 64.5 % (ref 43.0–77.0)
Platelets: 305 10*3/uL (ref 150.0–400.0)
RBC: 5.23 Mil/uL (ref 4.22–5.81)
RDW: 13.1 % (ref 11.5–15.5)
WBC: 8.4 10*3/uL (ref 4.0–10.5)

## 2023-06-18 MED ORDER — DIAZEPAM 2 MG PO TABS: 2.0000 mg | ORAL_TABLET | Freq: Three times a day (TID) | ORAL | 1 refills | Status: DC | PRN

## 2023-06-18 NOTE — Progress Notes (Signed)
Subjective:    David Silva is a 48 y.o. male and is here for a comprehensive physical exam.  HPI  Health Maintenance Due  Topic Date Due   Colonoscopy  Never done    Acute Concerns: None.  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  Endorses some fecal urgency and cramps in the mornings, attributes this to his uncontrolled spasms.  Pt is on Valium 5 mg as needed but not taking as often due to its strong effects that make him feel he will "fall over".  He has only been taking valium when he feels uncomfortable and difficulty sleeping.  His baclofen has decreased in effectiveness and is making him tired.  Was previously established with neuro with Lieutenant Diego at [facility] in 2017 Is interested in re-establishing with neurology, would like a referral.   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  Follows up with Dr. Katrinka Blazing of sports med.  Has not taken testosterone before but is taking DHEA regularly.  Health Maintenance: Immunizations -- UTD Colonoscopy -- Never done. Pt has fmhx of colon cancer (uncle).  PSA -- No fhmx or concerns. No results found for: "PSA1", "PSA" Diet -- Has been eating healthier. Drinks sufficient amounts of water, but also drinks sodas frequently. Eats low salt diet.  Sleep habits -- no concerns.  Exercise -- none  Weight --  Recent weight history Wt Readings from Last 10 Encounters:  06/18/23 238 lb (108 kg)  05/05/22 222 lb 9.6 oz (101 kg)  03/03/22 226 lb (102.5 kg)  02/27/22 223 lb (101.2 kg)  11/28/21 224 lb (101.6 kg)  10/17/21 225 lb (102.1 kg)  01/06/21 232 lb (105.2 kg)  10/02/19 234 lb 1.6 oz (106.2 kg)  01/13/19 229 lb (103.9 kg)  09/12/18 233 lb (105.7 kg)   Body mass index is 31.4 kg/m.  Mood -- Stable. No concerns.  Alcohol use --  reports current alcohol use.  Tobacco use --  Tobacco Use: Medium Risk (06/18/2023)   Patient History    Smoking Tobacco Use: Former    Smokeless Tobacco Use:  Never    Passive Exposure: Not on file    Eligible for Low Dose CT? no  UTD with eye doctor? Yes, got new glasses last year for his astigmatism.  UTD with dentist? No, looking for new dentist.      06/18/2023   11:12 AM  Depression screen PHQ 2/9  Decreased Interest 0  Down, Depressed, Hopeless 0  PHQ - 2 Score 0    Other providers/specialists: Patient Care Team: Jarold Motto, [place] as PCP - General (Physician Assistant) Wynema Birch, MD as Referring Physician (Neurology)    PMHx, SurgHx, SocialHx, Medications, and Allergies were reviewed in the Visit Navigator and updated as appropriate.   Past Medical History:  Diagnosis Date   Allergy    COVID-19 12/25/2020     Past Surgical History:  Procedure Laterality Date   Dystonia  2017     Family History  Problem Relation Age of Onset   Lung cancer Father    Diabetes Maternal Grandmother    Heart disease Maternal Grandmother    Colon cancer Maternal Grandfather    Heart failure Paternal Grandmother    Heart disease Paternal Grandfather    Cancer Paternal Aunt    Colon cancer Paternal Uncle     Social History   Tobacco Use   Smoking status: Former    Current packs/day: 0.00    Types: Cigarettes  Quit date: 06/01/2013    Years since quitting: 10.0   Smokeless tobacco: Never   Tobacco comments:    electronic cigs. daily  Vaping Use   Vaping status: Every Day  Substance Use Topics   Alcohol use: Yes    Comment: 4 drinks per  yearly   Drug use: No    Review of Systems:   Review of Systems  Constitutional:  Negative for chills, fever, malaise/fatigue and weight loss.  HENT:  Negative for hearing loss, sinus pain and sore throat.   Respiratory:  Negative for cough and hemoptysis.   Cardiovascular:  Negative for chest pain, palpitations, leg swelling and PND.  Gastrointestinal:  Negative for abdominal pain, constipation, diarrhea, heartburn, nausea and vomiting.  Genitourinary:  Negative for  dysuria, frequency and urgency.  Musculoskeletal:  Negative for back pain, myalgias and neck pain.  Skin:  Negative for itching and rash.  Neurological:  Negative for dizziness, tingling, seizures and headaches.  Endo/Heme/Allergies:  Negative for polydipsia.  Psychiatric/Behavioral:  Negative for depression. The patient is not nervous/anxious.      Objective:    Vitals:   06/18/23 1109  BP: 110/80  Pulse: 81  Temp: 98.4 F (36.9 C)  SpO2: 98%    Body mass index is 31.4 kg/m.  General  Alert, cooperative, no distress, appears stated age  Head:  Normocephalic, without obvious abnormality, atraumatic  Eyes:  PERRL, conjunctiva/corneas clear, EOM's intact, fundi benign, both eyes       Ears:  Normal TM's and external ear canals, both ears  Nose: Nares normal, septum midline, mucosa normal, no drainage or sinus tenderness  Throat: Lips, mucosa, and tongue normal; teeth and gums normal  Neck: Supple, symmetrical, trachea midline, no adenopathy;     thyroid:  No enlargement/tenderness/nodules; no carotid bruit or JVD  Back:   Symmetric, no curvature, ROM normal, no CVA tenderness  Lungs:   Clear to auscultation bilaterally, respirations unlabored  Chest wall:  No tenderness or deformity  Heart:  Regular rate and rhythm, S1 and S2 normal, no murmur, rub or gallop  Abdomen:   Soft, non-tender, bowel sounds active all four quadrants, no masses, no organomegaly  Extremities: Extremities normal, atraumatic, no cyanosis or edema  Prostate : Deferred   Skin: Skin color, texture, turgor normal, no rashes or lesions  Lymph nodes: Cervical, supraclavicular, and axillary nodes normal  Neurologic: CNII-XII grossly intact. Normal strength, sensation and reflexes throughout   AssessmentPlan:   Routine physical examination Today patient counseled on age appropriate routine health concerns for screening and prevention, each reviewed and up to date or declined. Immunizations reviewed and up  to date or declined. Labs ordered and reviewed. Risk factors for depression reviewed and negative. Hearing function and visual acuity are intact. ADLs screened and addressed as needed. Functional ability and level of safety reviewed and appropriate. Education, counseling and referrals performed based on assessed risks today. Patient provided with a copy of personalized plan for preventive services.  Dystonia Referral to [facility] Will change valium to 2 mg twice daily as needed given significant grogginess of current 5 mg dosage  Low testosterone in male Will check at next opportunity he can do morning blood draw  Special screening for malignant neoplasms, colon Update colonoscopy  Encounter for lipid screening for cardiovascular disease Update lipid panel and make recommendations  Obesity (BMI 30.0-34.9) Continue efforts at healthy lifestyle    I, Isabelle Course, acting as a scribe for Jarold Motto, [place]., have documented all relevant  documentation on the behalf of Jarold Motto, [place], as directed by  Jarold Motto, PA while in the presence of Jarold Motto, [place].  I, Isabelle Course, have reviewed all documentation for this visit. The documentation on 06/18/23 for the exam, diagnosis, procedures, and orders are all accurate and complete.  Jarold Motto, PA-C [facility]

## 2023-06-18 NOTE — Patient Instructions (Addendum)
It was great to see you!  I will place referral for gastroenterology and neurology   We will do labs today but if you are wanting your testosterone checked, please schedule morning lab appointment for that  Please go to the lab for blood work.   Our office will call you with your results unless you have chosen to receive results via MyChart.  If your blood work is normal we will follow-up each year for physicals and as scheduled for chronic medical problems.  If anything is abnormal we will treat accordingly and get you in for a follow-up.  Take care,  Lelon Mast

## 2023-06-21 ENCOUNTER — Encounter: Payer: Self-pay | Admitting: Physician Assistant

## 2023-06-23 ENCOUNTER — Other Ambulatory Visit: Payer: Self-pay | Admitting: Physician Assistant

## 2023-06-23 DIAGNOSIS — R7989 Other specified abnormal findings of blood chemistry: Secondary | ICD-10-CM

## 2023-09-03 ENCOUNTER — Other Ambulatory Visit (INDEPENDENT_AMBULATORY_CARE_PROVIDER_SITE_OTHER)

## 2023-09-03 DIAGNOSIS — R7989 Other specified abnormal findings of blood chemistry: Secondary | ICD-10-CM | POA: Diagnosis not present

## 2023-09-03 LAB — TESTOSTERONE: Testosterone: 216.89 ng/dL — ABNORMAL LOW (ref 300.00–890.00)

## 2023-09-03 LAB — PSA: PSA: 0.77 ng/mL (ref 0.10–4.00)

## 2023-09-05 ENCOUNTER — Encounter: Payer: Self-pay | Admitting: Physician Assistant

## 2024-02-21 ENCOUNTER — Other Ambulatory Visit: Payer: Self-pay | Admitting: Physician Assistant

## 2024-02-21 DIAGNOSIS — G249 Dystonia, unspecified: Secondary | ICD-10-CM

## 2024-02-22 NOTE — Telephone Encounter (Signed)
 Pt requesting refill for Diazepam  2 mg tablet. Last OV 06/18/2023.

## 2024-03-22 ENCOUNTER — Encounter

## 2024-03-28 ENCOUNTER — Ambulatory Visit

## 2024-03-28 VITALS — Ht 73.0 in | Wt 225.0 lb

## 2024-03-28 DIAGNOSIS — Z1211 Encounter for screening for malignant neoplasm of colon: Secondary | ICD-10-CM

## 2024-03-28 MED ORDER — NA SULFATE-K SULFATE-MG SULF 17.5-3.13-1.6 GM/177ML PO SOLN: 1.0000 | Freq: Once | ORAL | 0 refills | Status: AC

## 2024-03-28 NOTE — Progress Notes (Signed)

## 2024-03-30 ENCOUNTER — Encounter: Payer: Self-pay | Admitting: Gastroenterology

## 2024-04-04 ENCOUNTER — Encounter: Payer: Self-pay | Admitting: Gastroenterology

## 2024-04-04 ENCOUNTER — Ambulatory Visit (AMBULATORY_SURGERY_CENTER): Admitting: Gastroenterology

## 2024-04-04 VITALS — BP 124/89 | HR 87 | Temp 98.2°F | Resp 15 | Ht 73.0 in | Wt 225.0 lb

## 2024-04-04 DIAGNOSIS — D123 Benign neoplasm of transverse colon: Secondary | ICD-10-CM

## 2024-04-04 DIAGNOSIS — D125 Benign neoplasm of sigmoid colon: Secondary | ICD-10-CM | POA: Diagnosis not present

## 2024-04-04 DIAGNOSIS — Z1211 Encounter for screening for malignant neoplasm of colon: Secondary | ICD-10-CM | POA: Diagnosis not present

## 2024-04-04 MED ORDER — SODIUM CHLORIDE 0.9 % IV SOLN: 500.0000 mL | Freq: Once | INTRAVENOUS | Status: DC

## 2024-04-04 NOTE — Progress Notes (Signed)
 [facility] History and Physical   Primary Care Physician:  Job Lukes, [place]   Reason for Procedure:   Colon cancer screening  Plan:    Screening colonoscopy     HPI: David Silva is a 48 y.o. male undergoing initial average risk screening colonoscopy.  He has no family history of colon cancer (other than two grandparents diagnosed after age 8 on opposite sides of the family) and no chronic GI symptoms.    Past Medical History:  Diagnosis Date   Allergy    COVID-19 12/25/2020   Dystonia     Past Surgical History:  Procedure Laterality Date   Dystonia  2017   TESTICLE REMOVAL     when very young    Prior to Admission medications   Medication Sig Start Date End Date Taking? Authorizing Provider  diazepam  (VALIUM ) 2 MG tablet TAKE 1 TABLET BY MOUTH EVERY 8 HOURS AS NEEDED FOR MUSCLE SPASMS 02/22/24  Yes Job Lukes, PA  DHEA 50 MG CAPS Take 1 capsule by mouth. Take one capsule for 3 weeks and off x 1 week.    [provider]    Current Outpatient Medications  Medication Sig Dispense Refill   diazepam  (VALIUM ) 2 MG tablet TAKE 1 TABLET BY MOUTH EVERY 8 HOURS AS NEEDED FOR MUSCLE SPASMS 45 tablet 0   DHEA 50 MG CAPS Take 1 capsule by mouth. Take one capsule for 3 weeks and off x 1 week.     Current Facility-Administered Medications  Medication Dose Route Frequency Provider Last Rate Last Admin   0.9 %  sodium chloride  infusion  500 mL Intravenous Once Stacia Glendia BRAVO, MD        Allergies as of 04/04/2024 - Review Complete 04/04/2024  Allergen Reaction Noted   Amoxicillin  Nausea And Vomiting 04/30/2017   Sulfate Rash 09/30/2011    Family History  Problem Relation Age of Onset   Esophageal cancer Father    Lung cancer Father    Cancer Paternal Aunt    Colon cancer Paternal Uncle    Diabetes Maternal Grandmother    Heart disease Maternal Grandmother    Colon cancer Maternal Grandfather    Heart failure Paternal  Grandmother    Heart disease Paternal Grandfather    Rectal cancer Neg Hx    Stomach cancer Neg Hx     Social History   Socioeconomic History   Marital status: Married    Spouse name: Not on file   Number of children: 0   Years of education: college   Highest education level: Not on file  Occupational History    Employer: [employer]    Comment: [employer]  Tobacco Use   Smoking status: Former    Current packs/day: 0.00    Types: Cigarettes    Quit date: 06/01/2013    Years since quitting: 10.8   Smokeless tobacco: Never   Tobacco comments:    electronic cigarettes daily  Vaping Use   Vaping status: Every Day  Substance and Sexual Activity   Alcohol use: Yes    Comment: can't remember last time   Drug use: No   Sexual activity: Yes  Other Topics Concern   Not on file  Social History Narrative   Patient is married   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  Physical Exam: Vital signs BP (!) 128/95   Pulse 90   Temp 98.2 F (36.8 C)   Ht 6' 1 (1.854 m)   Wt 225 lb (102.1 kg)   SpO2 97%   BMI 29.69 kg/m   General:   Alert,  Well-developed, well-nourished, pleasant and cooperative in NAD Airway:  Mallampati 2 Lungs:  Clear throughout to auscultation.   Heart:  Regular rate and rhythm; no murmurs, clicks, rubs,  or gallops. Abdomen:  Soft, nontender and nondistended. Normal bowel sounds.   Neuro/Psych:  Normal mood and affect. A and O x 3   Oswaldo Cueto E. Stacia, MD [facility]

## 2024-04-04 NOTE — Op Note (Signed)
 [facility] Patient Name: David Silva Procedure Date: 04/04/2024 3:34 PM MRN: [id number] Endoscopist: Glendia E. Stacia , MD, [id number] Age: 48 Referring MD:  Date of Birth: Jan 22, 1976 Gender: Male Account #: [id number] Procedure:                Colonoscopy Indications:              Screening for colorectal malignant neoplasm, This                            is the patient's first colonoscopy Medicines:                Monitored Anesthesia Care Procedure:                Pre-Anesthesia Assessment:                           - Prior to the procedure, a History and Physical                            was performed, and patient medications and                            allergies were reviewed. The patient's tolerance of                            previous anesthesia was also reviewed. The risks                            and benefits of the procedure and the sedation                            options and risks were discussed with the patient.                            All questions were answered, and informed consent                            was obtained. Prior Anticoagulants: The patient has                            taken no anticoagulant or antiplatelet agents. ASA                            Grade Assessment: II - A patient with mild systemic                            disease. After reviewing the risks and benefits,                            the patient was deemed in satisfactory condition to                            undergo the procedure.  After obtaining informed consent, the colonoscope                            was passed under direct vision. Throughout the                            procedure, the patient's blood pressure, pulse, and                            oxygen saturations were monitored continuously. The                            Olympus Scope SN [id number] was introduced through the                            anus and  advanced to the the terminal ileum, with                            identification of the appendiceal orifice and IC                            valve. The colonoscopy was performed without                            difficulty. The patient tolerated the procedure                            well. The quality of the bowel preparation was                            excellent. The terminal ileum, ileocecal valve,                            appendiceal orifice, and rectum were photographed.                            The bowel preparation used was SUPREP via split                            dose instruction. Scope In: 3:54:04 PM Scope Out: 4:10:30 PM Scope Withdrawal Time: 0 hours 10 minutes 55 seconds  Total Procedure Duration: 0 hours 16 minutes 26 seconds  Findings:                 The perianal and digital rectal examinations were                            normal. Pertinent negatives include normal                            sphincter tone and no palpable rectal lesions.                           Four sessile polyps were found in the transverse  colon. The polyps were 4 to 6 mm in size. These                            polyps were removed with a cold snare. Resection                            and retrieval were complete. Estimated blood loss                            was minimal.                           A 4 mm polyp was found in the splenic flexure. The                            polyp was sessile. The polyp was removed with a                            cold snare. Resection and retrieval were complete.                            Estimated blood loss was minimal.                           A 4 mm polyp was found in the sigmoid colon. The                            polyp was sessile. The polyp was removed with a                            cold snare. Resection and retrieval were complete.                            Estimated blood loss was minimal.                            The exam was otherwise normal throughout the                            examined colon.                           The terminal ileum appeared normal.                           The retroflexed view of the distal rectum and anal                            verge was normal and showed no anal or rectal                            abnormalities. Complications:            No immediate complications. Estimated Blood Loss:  Estimated blood loss was minimal. Impression:               - Four 4 to 6 mm polyps in the transverse colon,                            removed with a cold snare. Resected and retrieved.                           - One 4 mm polyp at the splenic flexure, removed                            with a cold snare. Resected and retrieved.                           - One 4 mm polyp in the sigmoid colon, removed with                            a cold snare. Resected and retrieved.                           - The examined portion of the ileum was normal.                           - The distal rectum and anal verge are normal on                            retroflexion view. Recommendation:           - Patient has a contact number available for                            emergencies. The signs and symptoms of potential                            delayed complications were discussed with the                            patient. Return to normal activities tomorrow.                            Written discharge instructions were provided to the                            patient.                           - Resume previous diet.                           - Continue present medications.                           - Await pathology results.                           -  Repeat colonoscopy (date not yet determined) for                            surveillance based on pathology results. Adyn Hoes E. Stacia, MD 04/04/2024 4:18:48 PM This report has been signed electronically.

## 2024-04-04 NOTE — Progress Notes (Signed)
 Pt's states no medical or surgical changes since previsit or office visit.

## 2024-04-04 NOTE — Progress Notes (Signed)
 Called to room to assist during endoscopic procedure.  Patient ID and intended procedure confirmed with present staff. Received instructions for my participation in the procedure from the performing physician.

## 2024-04-04 NOTE — Patient Instructions (Signed)
 Resume previous diet.  Continue present medications. Awaiting pathology results. Repeat colonoscopy (date not yet determined) for surveillance based on pathology results. Handouts provided on polyps.   YOU HAD AN ENDOSCOPIC PROCEDURE TODAY AT THE [facility]:   Refer to the procedure report that was given to you for any specific questions about what was found during the examination.  If the procedure report does not answer your questions, please call your gastroenterologist to clarify.  If you requested that your care partner not be given the details of your procedure findings, then the procedure report has been included in a sealed envelope for you to review at your convenience later.  YOU SHOULD EXPECT: Some feelings of bloating in the abdomen. Passage of more gas than usual.  Walking can help get rid of the air that was put into your GI tract during the procedure and reduce the bloating. If you had a lower endoscopy (such as a colonoscopy or flexible sigmoidoscopy) you may notice spotting of blood in your stool or on the toilet paper. If you underwent a bowel prep for your procedure, you may not have a normal bowel movement for a few days.  Please Note:  You might notice some irritation and congestion in your nose or some drainage.  This is from the oxygen used during your procedure.  There is no need for concern and it should clear up in a day or so.  SYMPTOMS TO REPORT IMMEDIATELY:  Following lower endoscopy (colonoscopy or flexible sigmoidoscopy):  Excessive amounts of blood in the stool  Significant tenderness or worsening of abdominal pains  Swelling of the abdomen that is new, acute  Fever of 100F or higher  For urgent or emergent issues, a gastroenterologist can be reached at any hour by calling [phone]. Do not use MyChart messaging for urgent concerns.    DIET:  We do recommend a small meal at first, but then you may proceed to your regular diet.  Drink plenty  of fluids but you should avoid alcoholic beverages for 24 hours.  ACTIVITY:  You should plan to take it easy for the rest of today and you should NOT DRIVE or use heavy machinery until tomorrow (because of the sedation medicines used during the test).    FOLLOW UP: Our staff will call the number listed on your records the next business day following your procedure.  We will call around 7:15- 8:00 am to check on you and address any questions or concerns that you may have regarding the information given to you following your procedure. If we do not reach you, we will leave a message.     If any biopsies were taken you will be contacted by phone or by letter within the next 1-3 weeks.  Please call us  at [phone] if you have not heard about the biopsies in 3 weeks.    SIGNATURES/CONFIDENTIALITY: You and/or your care partner have signed paperwork which will be entered into your electronic medical record.  These signatures attest to the fact that that the information above on your After Visit Summary has been reviewed and is understood.  Full responsibility of the confidentiality of this discharge information lies with you and/or your care-partner.

## 2024-04-04 NOTE — Progress Notes (Signed)
 Sedate, gd SR, tolerated procedure well, VSS, report to RN

## 2024-04-05 ENCOUNTER — Telehealth: Payer: Self-pay

## 2024-04-05 NOTE — Telephone Encounter (Signed)
 No answer, left message to call if having any issues or concerns, B.Vester Titsworth RN

## 2024-04-07 LAB — SURGICAL PATHOLOGY

## 2024-04-14 ENCOUNTER — Ambulatory Visit: Payer: Self-pay | Admitting: Gastroenterology

## 2024-04-14 NOTE — Progress Notes (Signed)
 David Silva,   All six polyps that I removed during your recent procedure were completely benign but were proven to be pre-cancerous polyps that MAY have grown into cancers if they had not been removed.  Studies shows that at least 20% of women over age 47 and 30% of men over age 81 have pre-cancerous polyps.  Based on current nationally recognized surveillance guidelines, I recommend that you have a repeat colonoscopy in 3 years.   If you develop any new rectal bleeding, abdominal pain or significant bowel habit changes, please contact me before then.

## 2024-05-15 ENCOUNTER — Other Ambulatory Visit: Payer: Self-pay | Admitting: Physician Assistant

## 2024-05-15 DIAGNOSIS — G249 Dystonia, unspecified: Secondary | ICD-10-CM

## 2024-05-15 NOTE — Telephone Encounter (Signed)
 Pt requesting refill for Valium  2 mg tablet. Last OV 06/2023.

## 2024-06-20 ENCOUNTER — Ambulatory Visit (INDEPENDENT_AMBULATORY_CARE_PROVIDER_SITE_OTHER): Payer: BC Managed Care – PPO | Admitting: Physician Assistant

## 2024-06-20 ENCOUNTER — Encounter: Payer: Self-pay | Admitting: Physician Assistant

## 2024-06-20 VITALS — BP 136/86 | HR 68 | Temp 99.1°F | Ht 73.0 in | Wt 219.4 lb

## 2024-06-20 DIAGNOSIS — R6889 Other general symptoms and signs: Secondary | ICD-10-CM

## 2024-06-20 DIAGNOSIS — Z0000 Encounter for general adult medical examination without abnormal findings: Secondary | ICD-10-CM

## 2024-06-20 DIAGNOSIS — G249 Dystonia, unspecified: Secondary | ICD-10-CM

## 2024-06-20 DIAGNOSIS — E782 Mixed hyperlipidemia: Secondary | ICD-10-CM

## 2024-06-20 DIAGNOSIS — R7989 Other specified abnormal findings of blood chemistry: Secondary | ICD-10-CM

## 2024-06-20 DIAGNOSIS — E559 Vitamin D deficiency, unspecified: Secondary | ICD-10-CM

## 2024-06-20 NOTE — Progress Notes (Signed)
 "  Subjective:    David Silva is a 49 y.o. male and is here for a comprehensive physical exam.  HPI  Health Maintenance Due  Topic Date Due   Influenza Vaccine  12/31/2023    Discussed the use of AI scribe software for clinical note transcription with the patient, who gave verbal consent to proceed.  History of Present Illness   David Silva is a 49 year old male who presents with symptoms of an upper respiratory infection and muscle spasms.  He developed upper respiratory symptoms on Saturday with increased nasal discharge, head cold symptoms, upset stomach, and fatigue over the last two days. He denies severe sore throat, ear pain, or known sick contacts outside the home.  He has had severe muscle spasms over the last couple of days that are hard for him to distinguish from his other symptoms. He has chronic muscle issues that can cause reproducible chest wall pain and has previously seen a neurologist to assess tremors versus spasms.  He vapes daily after quitting smoking and is trying to cut down. He drinks alcohol only occasionally. Sleep is usually adequate, but over the past four days he has had night sweats and feels hot at night, which has disrupted his sleep.  He has lost weight from the 230s to 220 pounds, which he attributes to better diet and activity. He has not exercised recently due to a busy schedule.  He denies dysuria or urinary frequency, though he occasionally wakes once nightly to urinate, which he relates to muscle tightness. He denies unexplained headaches, severe sore throat, or significant skin changes.       Health Maintenance: Immunizations -- UpToDate  Colonoscopy -- completed, UpToDate  PSA --  Lab Results  Component Value Date   PSA 0.77 09/03/2023   Diet -- working towards healthy diet Exercise -- recently limited due to time with wife's new cancer diagnosis   Weight -- Weight: 219 lb 6.4 oz (99.5 kg)  Recent weight  history Wt Readings from Last 10 Encounters:  06/20/24 219 lb 6.4 oz (99.5 kg)  04/04/24 225 lb (102.1 kg)  03/28/24 225 lb (102.1 kg)  06/18/23 238 lb (108 kg)  05/05/22 222 lb 9.6 oz (101 kg)  03/03/22 226 lb (102.5 kg)  02/27/22 223 lb (101.2 kg)  11/28/21 224 lb (101.6 kg)  10/17/21 225 lb (102.1 kg)  01/06/21 232 lb (105.2 kg)   Body mass index is 28.95 kg/m.  Mood -- stable overall Alcohol use --  reports that he does not currently use alcohol.  Tobacco use --  Tobacco Use: Medium Risk (06/20/2024)   Patient History    Smoking Tobacco Use: Former    Smokeless Tobacco Use: Never    Passive Exposure: Not on file    Eligible for Low Dose CT? no  UTD with eye doctor? yes UTD with dentist? yes     06/20/2024   11:05 AM  Depression screen PHQ 2/9  Decreased Interest 0  Down, Depressed, Hopeless 0  PHQ - 2 Score 0    Other providers/specialists: Patient Care Team: Job Lukes, [place] as PCP - General (Physician Assistant) Corlis Harlene Bleacher, MD as Referring Physician (Neurology)    PMHx, SurgHx, SocialHx, Medications, and Allergies were reviewed in the Visit Navigator and updated as appropriate.   Past Medical History:  Diagnosis Date   Allergy    COVID-19 12/25/2020   Dystonia      Past Surgical History:  Procedure Laterality Date  Dystonia  2017   TESTICLE REMOVAL     when very young     Family History  Problem Relation Age of Onset   Esophageal cancer Father    Lung cancer Father    Cancer Paternal Aunt    Colon cancer Paternal Uncle    Diabetes Maternal Grandmother    Heart disease Maternal Grandmother    Colon cancer Maternal Grandfather    Heart failure Paternal Grandmother    Heart disease Paternal Grandfather    Rectal cancer Neg Hx    Stomach cancer Neg Hx     Social History[1]  Review of Systems:   Review of Systems  Constitutional:  Negative for chills, fever, malaise/fatigue and weight loss.  HENT:  Negative for hearing  loss, sinus pain and sore throat.   Respiratory:  Negative for cough and hemoptysis.   Cardiovascular:  Negative for chest pain, palpitations, leg swelling and PND.  Gastrointestinal:  Negative for abdominal pain, constipation, diarrhea, heartburn, nausea and vomiting.  Genitourinary:  Negative for dysuria, frequency and urgency.  Musculoskeletal:  Negative for back pain, myalgias and neck pain.  Skin:  Negative for itching and rash.  Neurological:  Negative for dizziness, tingling, seizures and headaches.  Endo/Heme/Allergies:  Negative for polydipsia.  Psychiatric/Behavioral:  Negative for depression. The patient is not nervous/anxious.     Objective:    Vitals:   06/20/24 1105  BP: 136/86  Pulse: 68  Temp: 99.1 F (37.3 C)  SpO2: 99%    Body mass index is 28.95 kg/m.  General  Alert, cooperative, no distress, appears stated age  Head:  Normocephalic, without obvious abnormality, atraumatic  Eyes:  PERRL, conjunctiva/corneas clear, EOM's intact, fundi benign, both eyes       Ears:  Normal TM's and external ear canals, both ears  Nose: Nares normal, septum midline, mucosa normal, no drainage or sinus tenderness  Throat: Lips, mucosa, and tongue normal; teeth and gums normal  Neck: Supple, symmetrical, trachea midline, no adenopathy;     thyroid:  No enlargement/tenderness/nodules; no carotid bruit or JVD  Back:   Symmetric, no curvature, ROM normal, no CVA tenderness  Lungs:   Clear to auscultation bilaterally, respirations unlabored  Chest wall:  No tenderness or deformity  Heart:  Regular rate and rhythm, S1 and S2 normal, no murmur, rub or gallop  Abdomen:   Soft, non-tender, bowel sounds active all four quadrants, no masses, no organomegaly  Extremities: Extremities normal, atraumatic, no cyanosis or edema  Prostate : Deferred   Skin: Skin color, texture, turgor normal, no rashes or lesions  Lymph nodes: Cervical, supraclavicular, and axillary nodes normal   Neurologic: CNII-XII grossly intact. Normal strength, sensation and reflexes throughout   AssessmentPlan:   Assessment and Plan    Comprehensive Physical Exam (CPE) preventive care annual visit Today patient counseled on age appropriate routine health concerns for screening and prevention, each reviewed and up to date or declined. Immunizations reviewed and up to date or declined. Labs ordered and reviewed. Risk factors for depression reviewed and negative. Hearing function and visual acuity are intact. ADLs screened and addressed as needed. Functional ability and level of safety reviewed and appropriate. Education, counseling and referrals performed based on assessed risks today. Patient provided with a copy of personalized plan for preventive services.  Flu-like symptoms  Symptoms consistent with upper respiratory infection. No flu or COVID testing needed. - Hold off on blood work until symptoms improve. - Monitor symptoms, consider antibiotics if condition worsens.  Low  testosterone  Reports cycling on and off DHEA with diminishing effects. Interested in assessing testosterone  levels. - Ordered testosterone  level test.  Dystonia Ongoing muscle spasms and tremors likely related to dystonia. Spasms have worsened in the last couple of days. - Continue follow-up with neurologist as needed - he will let us  know if/when he needs new referral  Vitamin D  deficiency Interest in checking vitamin D  levels due to potential deficiency. - Ordered vitamin D  level test.     Mixed hyperlipidemia  Update lipid panel and provide recommendations      Lucie Buttner, PA-C [facility]          [1]  Social History Tobacco Use   Smoking status: Former    Types: E-cigarettes   Smokeless tobacco: Never   Tobacco comments:    electronic cigarettes daily  Vaping Use   Vaping status: Every Day  Substance Use Topics   Alcohol use: Not Currently    Comment: can't remember last  time   Drug use: No   "

## 2025-06-22 ENCOUNTER — Encounter: Admitting: Physician Assistant
